# Patient Record
Sex: Female | Born: 1950
Health system: Southern US, Community
[De-identification: ages and names within clinical notes are randomized; demographics above are authoritative.]

## PROBLEM LIST (undated history)

## (undated) DIAGNOSIS — E785 Hyperlipidemia, unspecified: Secondary | ICD-10-CM

## (undated) DIAGNOSIS — T8859XA Other complications of anesthesia, initial encounter: Secondary | ICD-10-CM

## (undated) DIAGNOSIS — I10 Essential (primary) hypertension: Secondary | ICD-10-CM

## (undated) DIAGNOSIS — R112 Nausea with vomiting, unspecified: Secondary | ICD-10-CM

## (undated) DIAGNOSIS — M1990 Unspecified osteoarthritis, unspecified site: Secondary | ICD-10-CM

## (undated) DIAGNOSIS — M722 Plantar fascial fibromatosis: Secondary | ICD-10-CM

## (undated) DIAGNOSIS — Z9889 Other specified postprocedural states: Secondary | ICD-10-CM

## (undated) DIAGNOSIS — E039 Hypothyroidism, unspecified: Secondary | ICD-10-CM

## (undated) DIAGNOSIS — T4145XA Adverse effect of unspecified anesthetic, initial encounter: Secondary | ICD-10-CM

## (undated) DIAGNOSIS — S82009A Unspecified fracture of unspecified patella, initial encounter for closed fracture: Secondary | ICD-10-CM

## (undated) DIAGNOSIS — Z86010 Personal history of colonic polyps: Principal | ICD-10-CM

## (undated) DIAGNOSIS — R7302 Impaired glucose tolerance (oral): Secondary | ICD-10-CM

## (undated) DIAGNOSIS — N189 Chronic kidney disease, unspecified: Secondary | ICD-10-CM

## (undated) DIAGNOSIS — R011 Cardiac murmur, unspecified: Secondary | ICD-10-CM

## (undated) HISTORY — DX: Essential (primary) hypertension: I10

## (undated) HISTORY — DX: Impaired glucose tolerance (oral): R73.02

## (undated) HISTORY — DX: Chronic kidney disease, unspecified: N18.9

## (undated) HISTORY — PX: CATARACT EXTRACTION: SUR2

## (undated) HISTORY — DX: Cardiac murmur, unspecified: R01.1

## (undated) HISTORY — DX: Personal history of colonic polyps: Z86.010

## (undated) HISTORY — DX: Unspecified fracture of unspecified patella, initial encounter for closed fracture: S82.009A

## (undated) HISTORY — DX: Unspecified osteoarthritis, unspecified site: M19.90

## (undated) HISTORY — DX: Hyperlipidemia, unspecified: E78.5

---

## 1979-10-23 HISTORY — PX: APPENDECTOMY: SHX54

## 1981-10-22 HISTORY — PX: VAGINAL HYSTERECTOMY: SHX2639

## 1983-10-23 HISTORY — PX: COLON RESECTION: SHX5231

## 1993-10-22 HISTORY — PX: THYROIDECTOMY: SHX17

## 2001-10-22 HISTORY — PX: FOOT SURGERY: SHX648

## 2004-06-01 ENCOUNTER — Other Ambulatory Visit: Admission: RE | Admit: 2004-06-01 | Discharge: 2004-06-01 | Payer: Self-pay | Admitting: Family Medicine

## 2004-09-04 ENCOUNTER — Other Ambulatory Visit: Admission: RE | Admit: 2004-09-04 | Discharge: 2004-09-04 | Payer: Self-pay | Admitting: Family Medicine

## 2005-04-03 ENCOUNTER — Other Ambulatory Visit: Admission: RE | Admit: 2005-04-03 | Discharge: 2005-04-03 | Payer: Self-pay | Admitting: Family Medicine

## 2005-06-12 ENCOUNTER — Other Ambulatory Visit: Admission: RE | Admit: 2005-06-12 | Discharge: 2005-06-12 | Payer: Self-pay | Admitting: Family Medicine

## 2005-10-22 HISTORY — PX: OTHER SURGICAL HISTORY: SHX169

## 2006-01-15 ENCOUNTER — Emergency Department (HOSPITAL_COMMUNITY): Admission: EM | Admit: 2006-01-15 | Discharge: 2006-01-15 | Payer: Self-pay | Admitting: Emergency Medicine

## 2006-01-17 ENCOUNTER — Ambulatory Visit (HOSPITAL_COMMUNITY): Admission: RE | Admit: 2006-01-17 | Discharge: 2006-01-18 | Payer: Self-pay | Admitting: Orthopedic Surgery

## 2006-04-08 ENCOUNTER — Other Ambulatory Visit: Admission: RE | Admit: 2006-04-08 | Discharge: 2006-04-08 | Payer: Self-pay | Admitting: Family Medicine

## 2006-05-08 ENCOUNTER — Ambulatory Visit (HOSPITAL_COMMUNITY): Admission: RE | Admit: 2006-05-08 | Discharge: 2006-05-08 | Payer: Self-pay | Admitting: Family Medicine

## 2007-05-08 ENCOUNTER — Ambulatory Visit (HOSPITAL_COMMUNITY): Admission: RE | Admit: 2007-05-08 | Discharge: 2007-05-08 | Payer: Self-pay | Admitting: Family Medicine

## 2007-05-19 ENCOUNTER — Ambulatory Visit (HOSPITAL_COMMUNITY): Admission: RE | Admit: 2007-05-19 | Discharge: 2007-05-19 | Payer: Self-pay | Admitting: Family Medicine

## 2011-03-09 NOTE — Op Note (Signed)
NAMELUCYLE, ALUMBAUGH               ACCOUNT NO.:  [id number]   MEDICAL RECORD NO.:  [id number]          PATIENT TYPE:  OIB   LOCATION:  3010                         FACILITY:  [facility]   PHYSICIAN:  Doralee Albino. Carola Frost, M.D. DATE OF BIRTH:  Aug 03, 1951   DATE OF PROCEDURE:  01/17/2006  DATE OF DISCHARGE:  01/18/2006                                 OPERATIVE REPORT   PREOPERATIVE DIAGNOSIS:  Left patella fracture.   POSTOPERATIVE DIAGNOSIS:  Left patella fracture.   PROCEDURE:  Open reduction and internal fixation, left patella.   SURGEON:  Doralee Albino. Carola Frost, M.D.   ASSISTANT:  None.   ANESTHESIA:  General.   COMPLICATIONS:  None.   TOURNIQUET:  None.   ESTIMATED BLOOD LOSS:  150 mL, mostly hematoma.   DISPOSITION:  PACU.   CONDITION:  Stable.   BRIEF SUMMARY OF INDICATION FOR PROCEDURE:  Ms. Sargent is a where 60-year-  old female who had a work-related fall resulting in left knee pain and  inability to extend the knee.  Further workup demonstrated a patella  fracture.  After discussion of the risk and benefits of surgery including  the possibility of decreased range of motion of the knee, arthritis,  nonunion, malunion, nerve injury, vessel injury, need for further surgery,  DVT and PE and other complications, the patient wished proceed.   DESCRIPTION OF PROCEDURE:  Ms. Labarbera was administered a test dose of Ancef  given her allergies to PENICILLIN, which she tolerated quite well, and then  was able to receive a full dose.  She was taken to the operating room, where  general anesthesia was induced.  Her left lower extremity was prepped and  draped in the usual sterile fashion.  A standard midline incision was then  made of about 5 cm in length.  Dissection was carried down to the peritenon,  which was split longitudinally revealing the fracture site, which was full  of hematoma.  The hematoma was removed with a curette, which allowed [place] in  the intra-articular hematoma, which  was evacuated with suction.  A copious  irrigation and curettes were used to remove the adherent hematoma from the  fracture edges.  The fracture was then reduced and held provisionally with a  clamp.  Two 2-0 K-wires were inserted retrograde and then #7 cardiothoracic  wire used to create a figure-of-eight tension band construct.  The two limbs  were tightened sequentially and symmetrically in order to achieve  compression across the fracture site.  The tenaculum clamp was then released  and the knee taken through range of motion up to 45 degrees and noted to be  quite stable with no motion at the fracture site.  The retinaculum was then  oversewn with #1 figure-of-eight Vicryl sutures and then a 0 for the  peritenon layer and then the subcu with 2-0 and the skin with staples.  A  sterile gently compressive dressing was applied and a knee immobilizer.  The  patient was taken to the PACU in stable condition.   PROGNOSIS:  Ms. Guadalupe should do well following repair of this fracture  given the stable nature of the construct.  She should be allowed to  weightbear as tolerated in a knee immobilizer at two.  After her wound  appears to be stable, we will allow her to begin range of motion from 0-40  degrees with progressive range of motion by 30 degrees every two weeks  thereafter until achieving full motion with active extension beginning at  six weeks.  She will be on Lovenox for DVT prophylaxis while in the hospital  and discharged on a baby aspirin.Doralee Albino. Carola Frost, M.D.  Electronically Signed     [facility]/MEDQ  D:  01/17/2006  T:  01/19/2006  Job:  [id number]

## 2013-07-10 ENCOUNTER — Other Ambulatory Visit (HOSPITAL_COMMUNITY): Payer: Self-pay | Admitting: Family Medicine

## 2013-07-10 DIAGNOSIS — Z1231 Encounter for screening mammogram for malignant neoplasm of breast: Secondary | ICD-10-CM

## 2013-07-24 ENCOUNTER — Encounter (INDEPENDENT_AMBULATORY_CARE_PROVIDER_SITE_OTHER): Payer: Self-pay | Admitting: Ophthalmology

## 2013-07-24 DIAGNOSIS — H2510 Age-related nuclear cataract, unspecified eye: Secondary | ICD-10-CM

## 2013-07-24 DIAGNOSIS — H43819 Vitreous degeneration, unspecified eye: Secondary | ICD-10-CM

## 2013-07-24 DIAGNOSIS — I10 Essential (primary) hypertension: Secondary | ICD-10-CM

## 2013-07-24 DIAGNOSIS — H35039 Hypertensive retinopathy, unspecified eye: Secondary | ICD-10-CM

## 2013-07-27 ENCOUNTER — Ambulatory Visit (HOSPITAL_COMMUNITY)
Admission: RE | Admit: 2013-07-27 | Discharge: 2013-07-27 | Disposition: A | Discharge status: Home / Self Care | Payer: Self-pay | Source: Ambulatory Visit | Attending: Family Medicine | Admitting: Family Medicine

## 2013-07-27 DIAGNOSIS — Z1231 Encounter for screening mammogram for malignant neoplasm of breast: Secondary | ICD-10-CM

## 2014-07-20 ENCOUNTER — Other Ambulatory Visit (HOSPITAL_COMMUNITY): Payer: Self-pay | Admitting: Family Medicine

## 2014-07-20 DIAGNOSIS — Z1231 Encounter for screening mammogram for malignant neoplasm of breast: Secondary | ICD-10-CM

## 2014-08-09 ENCOUNTER — Ambulatory Visit (HOSPITAL_COMMUNITY)
Admission: RE | Admit: 2014-08-09 | Discharge: 2014-08-09 | Disposition: A | Discharge status: Home / Self Care | Payer: Self-pay | Source: Ambulatory Visit | Attending: Family Medicine | Admitting: Family Medicine

## 2014-08-09 DIAGNOSIS — Z1231 Encounter for screening mammogram for malignant neoplasm of breast: Secondary | ICD-10-CM

## 2015-05-31 DIAGNOSIS — N183 Chronic kidney disease, stage 3 (moderate): Secondary | ICD-10-CM | POA: Diagnosis not present

## 2015-05-31 DIAGNOSIS — Z23 Encounter for immunization: Secondary | ICD-10-CM | POA: Diagnosis not present

## 2015-05-31 DIAGNOSIS — E782 Mixed hyperlipidemia: Secondary | ICD-10-CM | POA: Diagnosis not present

## 2015-05-31 DIAGNOSIS — E890 Postprocedural hypothyroidism: Secondary | ICD-10-CM | POA: Diagnosis not present

## 2015-05-31 DIAGNOSIS — I10 Essential (primary) hypertension: Secondary | ICD-10-CM | POA: Diagnosis not present

## 2015-05-31 DIAGNOSIS — Z1272 Encounter for screening for malignant neoplasm of vagina: Secondary | ICD-10-CM | POA: Diagnosis not present

## 2015-05-31 DIAGNOSIS — Z0000 Encounter for general adult medical examination without abnormal findings: Secondary | ICD-10-CM | POA: Diagnosis not present

## 2015-05-31 DIAGNOSIS — Z1389 Encounter for screening for other disorder: Secondary | ICD-10-CM | POA: Diagnosis not present

## 2015-05-31 DIAGNOSIS — R7309 Other abnormal glucose: Secondary | ICD-10-CM | POA: Diagnosis not present

## 2015-05-31 DIAGNOSIS — Z9181 History of falling: Secondary | ICD-10-CM | POA: Diagnosis not present

## 2015-06-01 ENCOUNTER — Encounter: Payer: Self-pay | Admitting: Internal Medicine

## 2015-06-29 DIAGNOSIS — H2513 Age-related nuclear cataract, bilateral: Secondary | ICD-10-CM | POA: Diagnosis not present

## 2015-06-29 DIAGNOSIS — H40033 Anatomical narrow angle, bilateral: Secondary | ICD-10-CM | POA: Diagnosis not present

## 2015-07-05 DIAGNOSIS — H43393 Other vitreous opacities, bilateral: Secondary | ICD-10-CM | POA: Diagnosis not present

## 2015-07-20 ENCOUNTER — Ambulatory Visit (AMBULATORY_SURGERY_CENTER): Payer: Self-pay | Admitting: *Deleted

## 2015-07-20 VITALS — Ht 64.5 in | Wt 150.0 lb

## 2015-07-20 DIAGNOSIS — Z1211 Encounter for screening for malignant neoplasm of colon: Secondary | ICD-10-CM

## 2015-07-20 NOTE — Progress Notes (Signed)
Denies allergies to eggs or soy products. Denies complications with sedation or anesthesia. Denies O2 use. Denies use of diet or weight loss medications.  Emmi instructions not given for colonoscopy. Patient does not have access to a computer

## 2015-08-03 ENCOUNTER — Encounter: Payer: Self-pay | Admitting: Internal Medicine

## 2015-08-03 ENCOUNTER — Ambulatory Visit (AMBULATORY_SURGERY_CENTER): Payer: Commercial Managed Care - HMO | Admitting: Internal Medicine

## 2015-08-03 VITALS — BP 157/77 | HR 46 | Temp 97.3°F | Resp 17 | Ht 64.5 in | Wt 150.0 lb

## 2015-08-03 DIAGNOSIS — I10 Essential (primary) hypertension: Secondary | ICD-10-CM | POA: Diagnosis not present

## 2015-08-03 DIAGNOSIS — D120 Benign neoplasm of cecum: Secondary | ICD-10-CM

## 2015-08-03 DIAGNOSIS — Z800 Family history of malignant neoplasm of digestive organs: Secondary | ICD-10-CM

## 2015-08-03 DIAGNOSIS — Z1211 Encounter for screening for malignant neoplasm of colon: Secondary | ICD-10-CM

## 2015-08-03 MED ORDER — SODIUM CHLORIDE 0.9 % IV SOLN: 500.0000 mL | INTRAVENOUS | Status: DC

## 2015-08-03 NOTE — Progress Notes (Signed)
Called to room to assist during endoscopic procedure.  Patient ID and intended procedure confirmed with present staff. Received instructions for my participation in the procedure from the performing physician.  

## 2015-08-03 NOTE — Op Note (Signed)
[facility]  [address] [place], [address]   COLONOSCOPY PROCEDURE REPORT  PATIENT: Debra Barry, Debra Barry  MR#: [id number] BIRTHDATE: 1951/10/03 , 64  yrs. old GENDER: female ENDOSCOPIST: Gatha Mayer, MD, [facility] PROCEDURE DATE:  08/03/2015 PROCEDURE:   Colonoscopy, screening and Colonoscopy with snare polypectomy First Screening Colonoscopy - Avg.  risk and is 50 yrs.  old or older Yes.  Prior Negative Screening - Now for repeat screening. N/A  History of Adenoma - Now for follow-up colonoscopy & has been > or = to 3 yrs.  N/A  Polyps removed today? Yes ASA CLASS:   Class II INDICATIONS:Screening for colonic neoplasia and FH Colon or Rectal Adenocarcinoma. MEDICATIONS: Propofol 180 mg IV and Monitored anesthesia care  DESCRIPTION OF PROCEDURE:   After the risks benefits and alternatives of the procedure were thoroughly explained, informed consent was obtained.  The digital rectal exam revealed no abnormalities of the rectum.   The LB [id number]  endoscope was introduced through the anus and advanced to the cecum, which was identified by both the appendix and ileocecal valve. No adverse events experienced.   The quality of the prep was excellent. (MiraLax was used)  The instrument was then slowly withdrawn as the colon was fully examined. Estimated blood loss is zero unless otherwise noted in this procedure report.   COLON FINDINGS: A polypoid shaped sessile polyp measuring 4 mm in size was found at the cecum.  A polypectomy was performed with a cold snare.   There was diverticulosis noted in the sigmoid colon. The examination was otherwise normal.   Right colon retroflexion included.  Retroflexed views revealed no abnormalities. The time to cecum = 3.6 Withdrawal time = 8.3   The scope was withdrawn and the procedure completed. COMPLICATIONS: There were no immediate complications.  ENDOSCOPIC IMPRESSION: 1.   Sessile polyp was found at the cecum;  polypectomy was performed with a cold snare 2.   Diverticulosis was noted in the sigmoid colon 3.   The examination was otherwise normal - excellent prep  RECOMMENDATIONS: Timing of repeat colonoscopy will be determined by pathology findings.  She has FHx rectal cancer father age 38 and colon cancer sister age 4  eSigned:  Gatha Mayer, MD, [facility] 08/03/2015 12:06 PM   cc: Dr. Daiva Eves and The Patient

## 2015-08-03 NOTE — Progress Notes (Signed)
A/ox3 pleased with MAC, report to RN 

## 2015-08-03 NOTE — Patient Instructions (Addendum)
I found and removed 1 small polyp. You also have a condition called diverticulosis - common and not usually a problem. Please read the handout provided.  I will let you know pathology results and when to have another routine colonoscopy by mail. Likely in 5 years 2021  I appreciate the opportunity to care for you. Gatha Mayer, MD, FACG  YOU HAD AN ENDOSCOPIC PROCEDURE TODAY AT [facility]:   Refer to the procedure report that was given to you for any specific questions about what was found during the examination.  If the procedure report does not answer your questions, please call your gastroenterologist to clarify.  If you requested that your care partner not be given the details of your procedure findings, then the procedure report has been included in a sealed envelope for you to review at your convenience later.  YOU SHOULD EXPECT: Some feelings of bloating in the abdomen. Passage of more gas than usual.  Walking can help get rid of the air that was put into your GI tract during the procedure and reduce the bloating. If you had a lower endoscopy (such as a colonoscopy or flexible sigmoidoscopy) you may notice spotting of blood in your stool or on the toilet paper. If you underwent a bowel prep for your procedure, you may not have a normal bowel movement for a few days.  Please Note:  You might notice some irritation and congestion in your nose or some drainage.  This is from the oxygen used during your procedure.  There is no need for concern and it should clear up in a day or so.  SYMPTOMS TO REPORT IMMEDIATELY:   Following lower endoscopy (colonoscopy or flexible sigmoidoscopy):  Excessive amounts of blood in the stool  Significant tenderness or worsening of abdominal pains  Swelling of the abdomen that is new, acute  Fever of 100F or higher  For urgent or emergent issues, a gastroenterologist can be reached at any hour by calling [phone]  [phone].   DIET: Your first meal following the procedure should be a small meal and then it is ok to progress to your normal diet. Heavy or fried foods are harder to digest and may make you feel nauseous or bloated.  Likewise, meals heavy in dairy and vegetables can increase bloating.  Drink plenty of fluids but you should avoid alcoholic beverages for 24 hours.  ACTIVITY:  You should plan to take it easy for the rest of today and you should NOT DRIVE or use heavy machinery until tomorrow (because of the sedation medicines used during the test).    FOLLOW UP: Our staff will call the number listed on your records the next business day following your procedure to check on you and address any questions or concerns that you may have regarding the information given to you following your procedure. If we do not reach you, we will leave a message.  However, if you are feeling well and you are not experiencing any problems, there is no need to return our call.  We will assume that you have returned to your regular daily activities without incident.  If any biopsies were taken you will be contacted by phone or by letter within the next 1-3 weeks.  Please call us at [phone] if you have not heard about the biopsies in 3 weeks.    SIGNATURES/CONFIDENTIALITY: You and/or your care partner have signed paperwork which will be entered into your electronic medical record.  These signatures attest to the fact that that the information above on your After Visit Summary has been reviewed and is understood.  Full responsibility of the confidentiality of this discharge information lies with you and/or your care-partner.  Polyps, diverticulosis-handouts given  Repeat colonoscopy will be determined by pathology.

## 2015-08-04 ENCOUNTER — Telehealth: Payer: Self-pay | Admitting: *Deleted

## 2015-08-04 NOTE — Telephone Encounter (Signed)
  Follow up Call-  Call back number 08/03/2015  Post procedure Call Back phone  # [phone]  Permission to leave phone message Yes     Patient questions:  Do you have a fever, pain , or abdominal swelling? No. Pain Score  0 *  Have you tolerated food without any problems? Yes.    Have you been able to return to your normal activities? Yes.    Do you have any questions about your discharge instructions: Diet   No. Medications  No. Follow up visit  No.  Do you have questions or concerns about your Care? No.  Actions: * If pain score is 4 or above: No action needed, pain <4.

## 2015-08-11 ENCOUNTER — Encounter: Payer: Self-pay | Admitting: Internal Medicine

## 2015-08-11 DIAGNOSIS — Z860101 Personal history of adenomatous and serrated colon polyps: Secondary | ICD-10-CM

## 2015-08-11 DIAGNOSIS — Z86010 Personal history of colonic polyps: Secondary | ICD-10-CM

## 2015-08-11 DIAGNOSIS — Z800 Family history of malignant neoplasm of digestive organs: Secondary | ICD-10-CM | POA: Insufficient documentation

## 2015-08-11 HISTORY — DX: Personal history of colonic polyps: Z86.010

## 2015-08-11 HISTORY — DX: Personal history of adenomatous and serrated colon polyps: Z86.0101

## 2015-08-11 NOTE — Progress Notes (Signed)
Quick Note:  Diminutive adenoma + FHx CRCA father and sister Repeat colonoscopy 2021 ______

## 2015-09-05 DIAGNOSIS — Z1231 Encounter for screening mammogram for malignant neoplasm of breast: Secondary | ICD-10-CM | POA: Diagnosis not present

## 2015-09-05 DIAGNOSIS — Z6825 Body mass index (BMI) 25.0-25.9, adult: Secondary | ICD-10-CM | POA: Diagnosis not present

## 2015-09-05 DIAGNOSIS — E2839 Other primary ovarian failure: Secondary | ICD-10-CM | POA: Diagnosis not present

## 2015-09-05 DIAGNOSIS — M722 Plantar fascial fibromatosis: Secondary | ICD-10-CM | POA: Diagnosis not present

## 2015-09-27 DIAGNOSIS — Z6841 Body Mass Index (BMI) 40.0 and over, adult: Secondary | ICD-10-CM | POA: Diagnosis not present

## 2015-09-27 DIAGNOSIS — W101XXA Fall (on)(from) sidewalk curb, initial encounter: Secondary | ICD-10-CM | POA: Diagnosis not present

## 2015-09-27 DIAGNOSIS — M25562 Pain in left knee: Secondary | ICD-10-CM | POA: Diagnosis not present

## 2015-09-28 DIAGNOSIS — M722 Plantar fascial fibromatosis: Secondary | ICD-10-CM | POA: Diagnosis not present

## 2015-09-28 DIAGNOSIS — S82002A Unspecified fracture of left patella, initial encounter for closed fracture: Secondary | ICD-10-CM | POA: Diagnosis not present

## 2015-10-04 DIAGNOSIS — Z1231 Encounter for screening mammogram for malignant neoplasm of breast: Secondary | ICD-10-CM | POA: Diagnosis not present

## 2015-10-04 DIAGNOSIS — M810 Age-related osteoporosis without current pathological fracture: Secondary | ICD-10-CM | POA: Diagnosis not present

## 2015-10-06 DIAGNOSIS — R922 Inconclusive mammogram: Secondary | ICD-10-CM | POA: Diagnosis not present

## 2015-10-06 DIAGNOSIS — Z1231 Encounter for screening mammogram for malignant neoplasm of breast: Secondary | ICD-10-CM | POA: Diagnosis not present

## 2015-10-06 DIAGNOSIS — M810 Age-related osteoporosis without current pathological fracture: Secondary | ICD-10-CM | POA: Diagnosis not present

## 2015-10-06 DIAGNOSIS — R928 Other abnormal and inconclusive findings on diagnostic imaging of breast: Secondary | ICD-10-CM | POA: Diagnosis not present

## 2015-10-12 DIAGNOSIS — M722 Plantar fascial fibromatosis: Secondary | ICD-10-CM | POA: Diagnosis not present

## 2015-10-18 DIAGNOSIS — M25572 Pain in left ankle and joints of left foot: Secondary | ICD-10-CM | POA: Diagnosis not present

## 2015-11-02 DIAGNOSIS — M25572 Pain in left ankle and joints of left foot: Secondary | ICD-10-CM | POA: Diagnosis not present

## 2015-11-02 DIAGNOSIS — M722 Plantar fascial fibromatosis: Secondary | ICD-10-CM | POA: Diagnosis not present

## 2015-11-10 DIAGNOSIS — M722 Plantar fascial fibromatosis: Secondary | ICD-10-CM | POA: Diagnosis not present

## 2015-11-23 DIAGNOSIS — M722 Plantar fascial fibromatosis: Secondary | ICD-10-CM | POA: Diagnosis not present

## 2015-11-30 ENCOUNTER — Ambulatory Visit: Payer: Commercial Managed Care - HMO | Attending: Sports Medicine

## 2015-11-30 DIAGNOSIS — M722 Plantar fascial fibromatosis: Secondary | ICD-10-CM

## 2015-11-30 DIAGNOSIS — R531 Weakness: Secondary | ICD-10-CM

## 2015-11-30 DIAGNOSIS — R262 Difficulty in walking, not elsewhere classified: Secondary | ICD-10-CM

## 2015-11-30 NOTE — Therapy (Addendum)
[place], [place], [address] Phone: [phone]   Fax:  [phone]  Physical Therapy Evaluation  Patient Details  Name: Debra Barry MRN: [id number] Date of Birth: Apr 24, 1951 Referring Provider: Wandra Feinstein  Encounter Date: 11/30/2015      PT End of Session - 11/30/15 0942    Visit Number 1   Number of Visits 8   Date for PT Re-Evaluation 01/24/16   PT Start Time 0845   PT Stop Time 0930   PT Time Calculation (min) 45 min   Activity Tolerance Patient tolerated treatment well   Behavior During Therapy [facility] for tasks assessed/performed      Past Medical History  Diagnosis Date  . Hypertension   . Arthritis   . Heart murmur   . Patella fracture right     2014  . Hyperlipidemia   . Hx of adenomatous polyp of colon 08/11/2015    Past Surgical History  Procedure Laterality Date  . Appendectomy  1981  . Vaginal hysterectomy  1983  . Colon resection  1985    benign tumor  . Thyroidectomy  1995  . Foot surgery Left 2003  . Patella fracture repair Left 2007    There were no vitals filed for this visit.  Visit Diagnosis:  Plantar fasciitis of left foot - Plan: PT PLAN OF CARE CERT/RE-CERT  Weakness - Plan: PT PLAN OF CARE CERT/RE-CERT  Difficulty in walking - Plan: PT PLAN OF CARE CERT/RE-CERT      Subjective Assessment - 11/30/15 0854    Subjective L foot / heel pain began Nov 11th, 2016.  Pt received steroid injection without relief. Pt was prescribed walking shoe without relief, so they casted foot, but pt had an allergic reaction and cast was removed and pt was put back in shoe.  Pt is wearing walking shoe in weightbearing activities. At night, she has spint that she wears for sleeping.  Pt rpeorts pain worsens with prolonged activity, and is pain-free sitting. Pt fractured L knee Dec 6th, 2016 when she fell on ice, but L knee symptoms have resolved completely and does not have  difficulty with it.     Pertinent History HTN, heart murmur, arthritis, fracuted patella 2006 and again on Dec 5th. "Nodules" removed from L foot 2003.    How long can you sit comfortably? NA   How long can you stand comfortably? 15 mins    How long can you walk comfortably? 30 mins    Currently in Pain? Yes   Pain Score 0-No pain  3/10 with movement, at end of day 10/10.    Pain Location Heel   Pain Orientation Left   Pain Descriptors / Indicators Sharp;Stabbing;Throbbing   Pain Type Acute pain   Pain Onset More than a month ago   Pain Frequency Intermittent   Aggravating Factors  prolonged activity    Pain Relieving Factors elevate, ice     Effect of Pain on Daily Activities difficulty caring for her sister.             [facility] PT Assessment - 11/30/15 0001    Assessment   Medical Diagnosis L heel pain    Referring Provider Wandra Feinstein   Onset Date/Surgical Date 09/02/15   Hand Dominance Right   Next MD Visit 12/21/15   Prior Therapy steroid injection    Precautions   Precautions None   Restrictions   Weight Bearing Restrictions No   Balance Screen  Has the patient fallen in the past 6 months Yes   How many times? 1   Has the patient had a decrease in activity level because of a fear of falling?  No   Is the patient reluctant to leave their home because of a fear of falling?  No   Home Ecologist residence   Prior Function   Level of Independence Independent   Observation/Other Assessments   Focus on Therapeutic Outcomes (FOTO)  Intake: 38%, Predicted: 35%   ROM / Strength   AROM / PROM / Strength AROM;PROM;Strength   AROM   Right/Left Ankle Right;Left   Right Ankle Dorsiflexion 5   Right Ankle Plantar Flexion 60   Right Ankle Inversion 50   Right Ankle Eversion 12   Left Ankle Dorsiflexion 5   Left Ankle Plantar Flexion 52   Left Ankle Inversion 44   Left Ankle Eversion 10   Strength   Strength Assessment Site Ankle    Right/Left Ankle Right;Left   Right Ankle Dorsiflexion 4-/5   Right Ankle Plantar Flexion 3/5   Right Ankle Inversion 4-/5   Right Ankle Eversion 4-/5   Left Ankle Dorsiflexion 3+/5   Left Ankle Plantar Flexion 2-/5   Left Ankle Inversion 3+/5   Left Ankle Eversion 3+/5   Palpation   Palpation comment TP at L heel plantar surface, plantar fascia insertion , calcaneal tuberosity and fat pad.                             PT Education - 11/30/15 0941    Education provided Yes   Education Details PT POC for 3 times a week, but pt only able to come 1 x due to high co-pay. HEP: Ankle AROM.   Person(s) Educated Patient   Methods Explanation;Demonstration;Verbal cues;Handout   Comprehension Verbalized understanding;Returned demonstration          PT Short Term Goals - 11/30/15 1310    PT SHORT TERM GOAL #1   Title Pt will be I with initial HEP for continued stretching and strengthening and mobility.     Time 4   Period Weeks   Status New   PT SHORT TERM GOAL #2   Title L DF AROM will improve to 10 degrees pain-free for improved standing tolerance.    Time 4   Period Weeks   Status New           PT Long Term Goals - 11/30/15 1312    PT LONG TERM GOAL #1   Title FOTO score will improve from 38%  limited to 35% limited  to demo improved function and mobility by 01/24/16.   Time 8   Period Weeks   Status New   PT LONG TERM GOAL #2   Title Pt will tolerate standing and walking for 1 hour without pain by 01/24/16.    Time 8   Period Weeks   Status New   PT LONG TERM GOAL #3   Title L ankle PF strength will improve to 4/5 pain free in order to ascend and descend stairs, pain free by 01/24/16.    Time 8   Period Weeks   Status New               Plan - 11/30/15 1305    Clinical Impression Statement Pt presents for low complexity evaluation for L foot pain. Signs and symptoms are compatible with L  plantar fasciitis. Pt presents with impairments including  pain, impaired mobility/ROM, and impaired strength, which limit pt's functional abilities with walking, standing, stairs.  Pt will benefit from oupt PT for 3 times a week for 8 weeks in order to address these impairments and functional limitations and return pt to pain-free PLOF, but pt is only able to come 1 x a week due to co-pay.     Pt will benefit from skilled therapeutic intervention in order to improve on the following deficits Abnormal gait;Decreased activity tolerance;Decreased range of motion;Decreased strength;Difficulty walking;Impaired flexibility;Improper body mechanics;Impaired UE functional use;Pain   Rehab Potential Good   Clinical Impairments Affecting Rehab Potential only able to come 1 x a week.    PT Frequency 1x / week   PT Duration 8 weeks   PT Treatment/Interventions ADLs/Self Care Home Management;Ultrasound;Cryotherapy;Electrical Stimulation;Iontophoresis 4mg /ml Dexamethasone;Moist Heat;Therapeutic exercise;Therapeutic activities;Functional mobility training;Stair training;Gait training;Neuromuscular re-education;Patient/family education;Manual techniques;Taping;Passive range of motion;Dry needling   PT Next Visit Plan Est. stretches for plantarfascitis and prescribe in HEP. Manual therapy.  Education.  Pt only coming 1x a week so needs thourough HEP and education.    Consulted and Agree with Plan of Care Patient         01/04/2016 1307  PT G-Codes  Functional Assessment Tool Used    Functional Limitation  Mobility: Walking and moving around Mobility: Walking and moving around at 1307 on 04-Jan-2016 by Dollene Cleveland, PT  Mobility: Walking and Moving Around Current Status [phone])  At least 40 percent but less than 60 percent impaired, limited or restricted CK at 1307 on 01-04-16 by Dollene Cleveland, PT  Mobility: Walking and Moving Around Goal Status [phone])  At least 20 percent but less than 40 percent impaired, limited or restricted CJ at 1307 on 04-Jan-2016 by Dollene Cleveland, PT  Mobility: Walking and Moving Around Discharge Status [phone])          Problem List Patient Active Problem List   Diagnosis Date Noted  . Hx of adenomatous polyp of colon 08/11/2015  . Family history of colon cancer - father + sister both in 62's 08/11/2015    Dollene Cleveland, PT 11/30/2015, 1:24 PM  [facility] [address] [place], [place], [address] Phone: [phone]   Fax:  [phone]  Name: CYNDI DEWOLF MRN: [id number] Date of Birth: Jul 15, 1951

## 2015-11-30 NOTE — Patient Instructions (Signed)
10 x each. 3 times a day   ROM: Inversion / Eversion   With left leg relaxed, gently turn ankle and foot in and out. Move through full range of motion. Avoid pain. Repeat ____ times per set. Do ____ sets per session. Do ____ sessions per day.  http://orth.exer.us/36   Copyright  VHI. All rights reserved.  ROM: Plantar / Dorsiflexion   With left leg relaxed, gently flex and extend ankle. Move through full range of motion. Avoid pain. Repeat ____ times per set. Do ____ sets per session. Do ____ sessions per day.  http://orth.exer.us/34   Copyright  VHI. All rights reserved.  Ankle Alphabet   Using left ankle and foot only, trace the letters of the alphabet. Perform A to Z. Repeat ____ times per set. Do ____ sets per session. Do ____ sessions per day.  http://orth.exer.us/16   Copyright  VHI. All rights reserved.  Ankle Circles   Slowly rotate right foot and ankle clockwise then counterclockwise. Gradually increase range of motion. Avoid pain. Circle ____ times each direction per set. Do ____ sets per session. Do ____ sessions per day.  http://orth.exer.us/30   Copyright  VHI. All rights reserved.

## 2015-12-06 ENCOUNTER — Ambulatory Visit: Payer: Commercial Managed Care - HMO | Admitting: Physical Therapy

## 2015-12-06 DIAGNOSIS — M722 Plantar fascial fibromatosis: Secondary | ICD-10-CM | POA: Diagnosis not present

## 2015-12-06 DIAGNOSIS — R531 Weakness: Secondary | ICD-10-CM

## 2015-12-06 DIAGNOSIS — R262 Difficulty in walking, not elsewhere classified: Secondary | ICD-10-CM | POA: Diagnosis not present

## 2015-12-06 NOTE — Patient Instructions (Signed)
PLANTARFLEXION STRENGTHENING:   Balancing ActANKLE: Plantarflexion, Bilateral - Standing   Stand with upright posture. Raise heels up as high as possible. __10_ reps per set, __2_ sets per day, __7_ days per week Hold onto a support.     DORSIFLEXION STRENGTHENING:  Toe Raise (Standing)   Rock back on heels. Repeat __10-20__ times per set. Do __1-2__ sets per session. Do __2__ sessions per day.   SINGLE LIMB STANCE   Stance: single leg on floor. Raise leg. Hold __60 seconds. Repeat with other leg. _1__ reps per set, _2__ sets per day, __7_ days per week  Gastroc / Heel Cord Stretch - Seated With Towel   Sit on floor, towel around ball of foot. Gently pull foot in toward body, stretching heel cord and calf. Hold for ___ seconds. Repeat on involved leg. Repeat ___ times. Do ___ times per day.  Copyright  VHI. All rights reserved.  Achilles / Gastroc, Standing    Stand, left foot behind, heel on floor and turned slightly out, leg straight, forward leg bent. Move hips forward. Hold _30__ seconds. Repeat 3___ times per session. Do _2__ sessions per day.   Soleus Stretch    Stand with left foot back, both knees bent. Keeping heel on floor, turned slightly out, lean into wall until stretch is felt in lower calf. Hold _30___ seconds. Repeat __3__ times per set. Do _2___ sets per session. Do ____ sessions per day.  http://orth.exer.us/25   Inversion: Resisted   Cross legs with right leg underneath, foot in tubing loop. Hold tubing around other foot to resist and turn foot in. Repeat __10-20__ times per set. Do ___2_ sets per session. Do __2__ sessions per day.  http://orth.exer.us/12   Copyright  VHI. All rights reserved.  Eversion: Resisted   With right foot in tubing loop, hold tubing around other foot to resist and turn foot out. Repeat _10-20___ times per set. Do __2__ sets per session. Do __2__ sessions per day.  http://orth.exer.us/14   Copyright   VHI. All rights reserved.  Plantar Flexion: Resisted   Anchor behind, tubing around left foot, press down. Repeat __10-20__ times per set. Do _2___ sets per session. Do ____ sessions per day.  http://orth.exer.us/10   Copyright  VHI. All rights reserved.  Dorsiflexion: Resisted   Facing anchor, tubing around left foot, pull toward face.  Repeat __10-20__ times per set. Do __2__ sets per session. Do __2__ sessions per day.

## 2015-12-06 NOTE — Therapy (Addendum)
[place], [place], [address] Phone: [phone]   Fax:  [phone]  Physical Therapy Treatment  and Discharge Summary  Patient Details  Name: Debra Barry MRN: [id number] Date of Birth: 08-02-1951 Referring Provider: Wandra Feinstein  Encounter Date: 12/06/2015      PT End of Session - 12/06/15 0935    Visit Number 2   Number of Visits 8   Date for PT Re-Evaluation 01/24/16   PT Start Time 0931   PT Stop Time 1012   PT Time Calculation (min) 41 min      Past Medical History  Diagnosis Date  . Hypertension   . Arthritis   . Heart murmur   . Patella fracture right     2014  . Hyperlipidemia   . Hx of adenomatous polyp of colon 08/11/2015    Past Surgical History  Procedure Laterality Date  . Appendectomy  1981  . Vaginal hysterectomy  1983  . Colon resection  1985    benign tumor  . Thyroidectomy  1995  . Foot surgery Left 2003  . Patella fracture repair Left 2007    There were no vitals filed for this visit.  Visit Diagnosis:  Plantar fasciitis of left foot  Weakness  Difficulty in walking      Subjective Assessment - 12/06/15 1006    Subjective The exercises have helped with my flexibility and a little with the pain. This will have to be ,y last visit. I do not have the $45 co-pay   Currently in Pain? Yes   Pain Score 3    Pain Location Heel   Pain Orientation Left   Aggravating Factors  prolonged activity more than 1 hour   Pain Relieving Factors elevate, ice            [facility] PT Assessment - 12/06/15 0940    AROM   Right Ankle Dorsiflexion 7   Left Ankle Dorsiflexion 7                     [facility] Adult PT Treatment/Exercise - 12/06/15 0001    Ankle Exercises: Stretches   Soleus Stretch 2 reps;30 seconds   Gastroc Stretch 2 reps;30 seconds   Gastroc Stretch Limitations seated with towel and standing runners stretch vs step stretch   Ankle  Exercises: Standing   SLS 10 sec best bilateral   Heel Raises 10 reps   Toe Raise 10 reps   Ankle Exercises: Supine   T-Band red band x 15 each                PT Education - 12/06/15 1003    Education provided Yes   Education Details 4 way ankle red band, gastroc/ soleus stretch, SLS, heel/toe raises   Person(s) Educated Patient   Methods Explanation;Handout   Comprehension Verbalized understanding          PT Short Term Goals - 12/06/15 1029    PT SHORT TERM GOAL #1   Title Pt will be I with initial HEP for continued stretching and strengthening and mobility.     Time 4   Period Weeks   Status Achieved   PT SHORT TERM GOAL #2   Title L DF AROM will improve to 10 degrees pain-free for improved standing tolerance.    Time 4   Period Weeks   Status On-going           PT Long Term Goals -  12/06/15 1029    PT LONG TERM GOAL #1   Title FOTO score will improve from 38%  limited to 35% limited  to demo improved function and mobility by 01/24/16.   Time 8   Period Weeks   Status Unable to assess   PT LONG TERM GOAL #2   Title Pt will tolerate standing and walking for 1 hour without pain by 01/24/16.    Time 8   Period Weeks   Status On-going   PT LONG TERM GOAL #3   Title L ankle PF strength will improve to 4/5 pain free in order to ascend and descend stairs, pain free by 01/24/16.    Time 8   Period Weeks   Status On-going               Plan - 12/06/15 1030    Clinical Impression Statement Pt would like to hold PT after today due to copay. She will perform HEP until MD appointment in 2 weeks. After follow up with MD she may schedule another appointment or call and discharge. DF improved from 5 degrees to 7 degrees. Pt independent with initial HEP. STG#1 met.    PT Next Visit Plan Hold PT due to co-pay. Pt to call after MD follow up in 2 weeks.         Problem List Patient Active Problem List   Diagnosis Date Noted  . Hx of adenomatous polyp of  colon 08/11/2015  . Family history of colon cancer - father + sister both in 40's 08/11/2015    Dorene Ar, PTA 12/06/2015, 10:34 AM   Addendum 12/27/2015 By Dollene Cleveland, PT, DPT 12/27/2015 8:02 AM Phone: [phone] Fax: [phone]   PHYSICAL THERAPY DISCHARGE SUMMARY  Visits from Start of Care: 2  Current functional level related to goals / functional outcomes: See above    Remaining deficits: Pain, ROM, strength, difficulty with walking, standing, ADLs, stairs.   Education / Equipment: Indep with intial HEP.   Plan: Patient agrees to discharge.  Patient goals were not met. Patient is being discharged due to not returning since the last visit.  ?????        Pt held PT after 2 visits due to high co-pay and planned to call and schedule further visits or request DC following MD visits. No return call received following pt's MD visit to schedule further appts. Therefore, pt is discharged due to no further visits made  Dollene Cleveland, PT, DPT 12/27/2015 8:04 AM Phone: [phone] Fax: [phone]  .  [place] [place], [place], [address] Phone: [phone]   Fax:  [phone]  Name: Debra Barry MRN: [id number] Date of Birth: Mar 11, 1951

## 2015-12-07 DIAGNOSIS — N183 Chronic kidney disease, stage 3 (moderate): Secondary | ICD-10-CM | POA: Diagnosis not present

## 2015-12-07 DIAGNOSIS — E782 Mixed hyperlipidemia: Secondary | ICD-10-CM | POA: Diagnosis not present

## 2015-12-07 DIAGNOSIS — I10 Essential (primary) hypertension: Secondary | ICD-10-CM | POA: Diagnosis not present

## 2015-12-07 DIAGNOSIS — E890 Postprocedural hypothyroidism: Secondary | ICD-10-CM | POA: Diagnosis not present

## 2015-12-07 DIAGNOSIS — Z1159 Encounter for screening for other viral diseases: Secondary | ICD-10-CM | POA: Diagnosis not present

## 2015-12-07 DIAGNOSIS — R7303 Prediabetes: Secondary | ICD-10-CM | POA: Diagnosis not present

## 2015-12-07 DIAGNOSIS — Z6826 Body mass index (BMI) 26.0-26.9, adult: Secondary | ICD-10-CM | POA: Diagnosis not present

## 2015-12-21 DIAGNOSIS — M722 Plantar fascial fibromatosis: Secondary | ICD-10-CM | POA: Diagnosis not present

## 2016-01-10 DIAGNOSIS — M722 Plantar fascial fibromatosis: Secondary | ICD-10-CM | POA: Diagnosis not present

## 2016-01-24 DIAGNOSIS — E890 Postprocedural hypothyroidism: Secondary | ICD-10-CM | POA: Diagnosis not present

## 2016-02-01 ENCOUNTER — Encounter (HOSPITAL_BASED_OUTPATIENT_CLINIC_OR_DEPARTMENT_OTHER): Payer: Self-pay | Admitting: *Deleted

## 2016-02-06 ENCOUNTER — Encounter (HOSPITAL_BASED_OUTPATIENT_CLINIC_OR_DEPARTMENT_OTHER)
Admission: RE | Admit: 2016-02-06 | Discharge: 2016-02-06 | Disposition: A | Discharge status: Home / Self Care | Payer: Commercial Managed Care - HMO | Source: Ambulatory Visit | Attending: Orthopedic Surgery | Admitting: Orthopedic Surgery

## 2016-02-06 ENCOUNTER — Other Ambulatory Visit: Payer: Self-pay

## 2016-02-06 ENCOUNTER — Other Ambulatory Visit: Payer: Self-pay | Admitting: Physician Assistant

## 2016-02-06 DIAGNOSIS — R001 Bradycardia, unspecified: Secondary | ICD-10-CM | POA: Diagnosis not present

## 2016-02-06 DIAGNOSIS — M722 Plantar fascial fibromatosis: Secondary | ICD-10-CM | POA: Diagnosis not present

## 2016-02-06 DIAGNOSIS — Z79899 Other long term (current) drug therapy: Secondary | ICD-10-CM | POA: Diagnosis not present

## 2016-02-06 DIAGNOSIS — I10 Essential (primary) hypertension: Secondary | ICD-10-CM | POA: Diagnosis not present

## 2016-02-06 DIAGNOSIS — Z87891 Personal history of nicotine dependence: Secondary | ICD-10-CM | POA: Diagnosis not present

## 2016-02-06 NOTE — H&P (Signed)
This is a pleasant 65 year-old female who presents to our clinic today with left heel pain.  This has been ongoing for the past five months.  She stands on her feet a lot and attributes her pain to this.  Worse at the end of the day.  All of her pain is located to her heel at the plantar fascia insertion.  No retrocalcaneal pain.  She describes occasional needle poking her in the heel.  Worse with increased activity, as well as walking.  She has tried exercises for her Achilles, as well as cold and heat, which seems to help a little.  She has also been wearing night splints.  She comes in today wanting to proceed with definitive treatment with a plantar fasciotomy. Past medical, social and family history reviewed in detail on the patient questionnaire and signed.  Review of systems: As detailed in HPI.  All others reviewed and are negative.   EXAMINATION: Well-developed, well-nourished female in no acute distress.  Alert and oriented x 3.  Lungs clear to auscultation bilaterally.  Heart sounds normal.  Examination of her left foot reveals marked tenderness to the plantar fascia insertion.  No retrocalcaneal tenderness.  She can dorsiflex to about 10 degrees.  She is neurovascularly intact distally.    X-RAYS: X-rays reveal a moderate plantar fascial spur.    IMPRESSION: Left foot plantar fasciitis.    PLAN: At this point Debra Barry has exhausted nearly all conservative treatment options and we feel it is appropriate to proceed with left heel endoscopic plantar fasciotomy.  Risks, benefits and possible complications of surgery have been reviewed.  Rehab and recovery time discussed. All questions answered.  We will see Debra Barry at the time of operative intervention.    Debra Barry, M.D.

## 2016-02-09 ENCOUNTER — Encounter (HOSPITAL_BASED_OUTPATIENT_CLINIC_OR_DEPARTMENT_OTHER): Payer: Self-pay | Admitting: *Deleted

## 2016-02-09 ENCOUNTER — Ambulatory Visit (HOSPITAL_BASED_OUTPATIENT_CLINIC_OR_DEPARTMENT_OTHER): Payer: Commercial Managed Care - HMO | Admitting: Anesthesiology

## 2016-02-09 ENCOUNTER — Encounter (HOSPITAL_BASED_OUTPATIENT_CLINIC_OR_DEPARTMENT_OTHER)
Admission: RE | Disposition: A | Discharge status: Home / Self Care | Payer: Self-pay | Source: Ambulatory Visit | Attending: Orthopedic Surgery

## 2016-02-09 ENCOUNTER — Ambulatory Visit (HOSPITAL_BASED_OUTPATIENT_CLINIC_OR_DEPARTMENT_OTHER)
Admission: RE | Admit: 2016-02-09 | Discharge: 2016-02-09 | Disposition: A | Discharge status: Home / Self Care | Payer: Commercial Managed Care - HMO | Source: Ambulatory Visit | Attending: Orthopedic Surgery | Admitting: Orthopedic Surgery

## 2016-02-09 DIAGNOSIS — M722 Plantar fascial fibromatosis: Secondary | ICD-10-CM | POA: Diagnosis not present

## 2016-02-09 DIAGNOSIS — Z87891 Personal history of nicotine dependence: Secondary | ICD-10-CM | POA: Insufficient documentation

## 2016-02-09 DIAGNOSIS — I10 Essential (primary) hypertension: Secondary | ICD-10-CM | POA: Insufficient documentation

## 2016-02-09 DIAGNOSIS — R001 Bradycardia, unspecified: Secondary | ICD-10-CM | POA: Insufficient documentation

## 2016-02-09 DIAGNOSIS — Z79899 Other long term (current) drug therapy: Secondary | ICD-10-CM | POA: Diagnosis not present

## 2016-02-09 HISTORY — DX: Plantar fascial fibromatosis: M72.2

## 2016-02-09 HISTORY — DX: Adverse effect of unspecified anesthetic, initial encounter: T41.45XA

## 2016-02-09 HISTORY — DX: Nausea with vomiting, unspecified: R11.2

## 2016-02-09 HISTORY — DX: Other complications of anesthesia, initial encounter: T88.59XA

## 2016-02-09 HISTORY — DX: Hypothyroidism, unspecified: E03.9

## 2016-02-09 HISTORY — PX: PLANTAR FASCIA RELEASE: SHX2239

## 2016-02-09 HISTORY — DX: Other specified postprocedural states: Z98.890

## 2016-02-09 SURGERY — FASCIOTOMY, PLANTAR, ENDOSCOPIC
Anesthesia: General | Site: Foot | Laterality: Left

## 2016-02-09 MED ORDER — PROPOFOL 500 MG/50ML IV EMUL
INTRAVENOUS | Status: AC
  Filled 2016-02-09: qty 50

## 2016-02-09 MED ORDER — LACTATED RINGERS IV SOLN
INTRAVENOUS | Status: DC
  Administered 2016-02-09: 10 mL/h via INTRAVENOUS

## 2016-02-09 MED ORDER — CHLORHEXIDINE GLUCONATE 4 % EX LIQD: 60.0000 mL | Freq: Once | CUTANEOUS | Status: DC

## 2016-02-09 MED ORDER — MEPERIDINE HCL 25 MG/ML IJ SOLN: 6.2500 mg | INTRAMUSCULAR | Status: DC | PRN

## 2016-02-09 MED ORDER — LIDOCAINE HCL (CARDIAC) 20 MG/ML IV SOLN
INTRAVENOUS | Status: DC | PRN
  Administered 2016-02-09: 50 mg via INTRAVENOUS

## 2016-02-09 MED ORDER — SCOPOLAMINE 1 MG/3DAYS TD PT72
MEDICATED_PATCH | TRANSDERMAL | Status: AC
  Filled 2016-02-09: qty 1

## 2016-02-09 MED ORDER — MIDAZOLAM HCL 2 MG/2ML IJ SOLN
1.0000 mg | INTRAMUSCULAR | Status: DC | PRN
  Administered 2016-02-09: 2 mg via INTRAVENOUS

## 2016-02-09 MED ORDER — BUPIVACAINE HCL (PF) 0.5 % IJ SOLN
INTRAMUSCULAR | Status: AC
  Filled 2016-02-09: qty 90

## 2016-02-09 MED ORDER — SCOPOLAMINE 1 MG/3DAYS TD PT72
1.0000 | MEDICATED_PATCH | Freq: Once | TRANSDERMAL | Status: DC | PRN
  Administered 2016-02-09: 1.5 mg via TRANSDERMAL

## 2016-02-09 MED ORDER — LACTATED RINGERS IV SOLN: INTRAVENOUS | Status: DC

## 2016-02-09 MED ORDER — CLINDAMYCIN PHOSPHATE 900 MG/50ML IV SOLN
900.0000 mg | INTRAVENOUS | Status: AC
  Administered 2016-02-09: 900 mg via INTRAVENOUS

## 2016-02-09 MED ORDER — MIDAZOLAM HCL 2 MG/2ML IJ SOLN
INTRAMUSCULAR | Status: AC
  Filled 2016-02-09: qty 2

## 2016-02-09 MED ORDER — DEXAMETHASONE SODIUM PHOSPHATE 4 MG/ML IJ SOLN
INTRAMUSCULAR | Status: DC | PRN
  Administered 2016-02-09: 10 mg via INTRAVENOUS

## 2016-02-09 MED ORDER — ONDANSETRON HCL 4 MG/2ML IJ SOLN
INTRAMUSCULAR | Status: AC
  Filled 2016-02-09: qty 2

## 2016-02-09 MED ORDER — ONDANSETRON HCL 4 MG PO TABS: 4.0000 mg | ORAL_TABLET | Freq: Three times a day (TID) | ORAL | Status: DC | PRN

## 2016-02-09 MED ORDER — FENTANYL CITRATE (PF) 100 MCG/2ML IJ SOLN
50.0000 ug | INTRAMUSCULAR | Status: DC | PRN
Start: 2016-02-09 — End: 2016-02-09
  Administered 2016-02-09: 100 ug via INTRAVENOUS

## 2016-02-09 MED ORDER — CLINDAMYCIN PHOSPHATE 900 MG/50ML IV SOLN
INTRAVENOUS | Status: AC
  Filled 2016-02-09: qty 50

## 2016-02-09 MED ORDER — DEXAMETHASONE SODIUM PHOSPHATE 10 MG/ML IJ SOLN
INTRAMUSCULAR | Status: AC
  Filled 2016-02-09: qty 1

## 2016-02-09 MED ORDER — LIDOCAINE HCL (CARDIAC) 20 MG/ML IV SOLN
INTRAVENOUS | Status: AC
  Filled 2016-02-09: qty 5

## 2016-02-09 MED ORDER — HYDROMORPHONE HCL 2 MG PO TABS
2.0000 mg | ORAL_TABLET | ORAL | Status: DC | PRN
Start: 2016-02-09 — End: 2016-08-17

## 2016-02-09 MED ORDER — GLYCOPYRROLATE 0.2 MG/ML IJ SOLN
0.2000 mg | Freq: Once | INTRAMUSCULAR | Status: AC | PRN
  Administered 2016-02-09: 0.2 mg via INTRAVENOUS

## 2016-02-09 MED ORDER — ONDANSETRON HCL 4 MG/2ML IJ SOLN
INTRAMUSCULAR | Status: DC | PRN
  Administered 2016-02-09: 4 mg via INTRAVENOUS

## 2016-02-09 MED ORDER — PROPOFOL 10 MG/ML IV BOLUS
INTRAVENOUS | Status: DC | PRN
  Administered 2016-02-09: 170 mg via INTRAVENOUS

## 2016-02-09 MED ORDER — FENTANYL CITRATE (PF) 100 MCG/2ML IJ SOLN
INTRAMUSCULAR | Status: AC
  Filled 2016-02-09: qty 2

## 2016-02-09 MED ORDER — METOCLOPRAMIDE HCL 5 MG/ML IJ SOLN: 10.0000 mg | Freq: Once | INTRAMUSCULAR | Status: DC | PRN

## 2016-02-09 MED ORDER — FENTANYL CITRATE (PF) 100 MCG/2ML IJ SOLN: 25.0000 ug | INTRAMUSCULAR | Status: DC | PRN

## 2016-02-09 MED ORDER — BUPIVACAINE HCL (PF) 0.5 % IJ SOLN
INTRAMUSCULAR | Status: DC | PRN
Start: 2016-02-09 — End: 2016-02-09
  Administered 2016-02-09: 10 mL

## 2016-02-09 SURGICAL SUPPLY — 48 items
APPLICATOR COTTON TIP 6IN STRL (MISCELLANEOUS) ×10 IMPLANT
BANDAGE ACE 3X5.8 VEL STRL LF (GAUZE/BANDAGES/DRESSINGS) ×1 IMPLANT
BANDAGE ACE 4X5 VEL STRL LF (GAUZE/BANDAGES/DRESSINGS) ×1 IMPLANT
BANDAGE ESMARK 6X9 LF (GAUZE/BANDAGES/DRESSINGS) ×1 IMPLANT
BLADE CUTTER GATOR 3.5 (BLADE) ×2 IMPLANT
BLADE GREAT WHITE 4.2 (BLADE) IMPLANT
BLADE SURG 15 STRL LF DISP TIS (BLADE) ×1 IMPLANT
BLADE SURG 15 STRL SS (BLADE) ×2
BLADE TRIANGLE EPF/EGR ENDO (BLADE) ×2 IMPLANT
BNDG CMPR 9X6 STRL LF SNTH (GAUZE/BANDAGES/DRESSINGS) ×1
BNDG COHESIVE 4X5 TAN STRL (GAUZE/BANDAGES/DRESSINGS) ×2 IMPLANT
BNDG ESMARK 6X9 LF (GAUZE/BANDAGES/DRESSINGS) ×2
BNDG GAUZE ELAST 4 BULKY (GAUZE/BANDAGES/DRESSINGS) ×2 IMPLANT
BUR CUDA 2.9 (BURR) IMPLANT
BUR GATOR 2.9 (BURR) IMPLANT
CANISTER SUCT 1200ML W/VALVE (MISCELLANEOUS) ×2 IMPLANT
COVER BACK TABLE 60X90IN (DRAPES) ×2 IMPLANT
CUFF TOURNIQUET SINGLE 18IN (TOURNIQUET CUFF) IMPLANT
CUFF TOURNIQUET SINGLE 34IN LL (TOURNIQUET CUFF) IMPLANT
DECANTER SPIKE VIAL GLASS SM (MISCELLANEOUS) IMPLANT
DRAPE EXTREMITY T 121X128X90 (DRAPE) ×2 IMPLANT
DRAPE IMP U-DRAPE 54X76 (DRAPES) ×2 IMPLANT
DRAPE OEC MINIVIEW 54X84 (DRAPES) ×2 IMPLANT
DRAPE U-SHAPE 47X51 STRL (DRAPES) ×2 IMPLANT
DURAPREP 26ML APPLICATOR (WOUND CARE) ×2 IMPLANT
GAUZE SPONGE 4X4 12PLY STRL (GAUZE/BANDAGES/DRESSINGS) ×2 IMPLANT
GAUZE XEROFORM 1X8 LF (GAUZE/BANDAGES/DRESSINGS) ×2 IMPLANT
GLOVE BIO SURGEON STRL SZ 6.5 (GLOVE) ×1 IMPLANT
GLOVE BIOGEL PI IND STRL 7.0 (GLOVE) ×1 IMPLANT
GLOVE BIOGEL PI INDICATOR 7.0 (GLOVE) ×4
GLOVE ECLIPSE 7.0 STRL STRAW (GLOVE) ×2 IMPLANT
GLOVE SURG ORTHO 8.0 STRL STRW (GLOVE) ×2 IMPLANT
GOWN STRL REUS W/ TWL LRG LVL3 (GOWN DISPOSABLE) ×2 IMPLANT
GOWN STRL REUS W/ TWL XL LVL3 (GOWN DISPOSABLE) ×1 IMPLANT
GOWN STRL REUS W/TWL LRG LVL3 (GOWN DISPOSABLE) ×2
GOWN STRL REUS W/TWL XL LVL3 (GOWN DISPOSABLE) ×6 IMPLANT
NEEDLE HYPO 22GX1.5 SAFETY (NEEDLE) ×1 IMPLANT
NS IRRIG 1000ML POUR BTL (IV SOLUTION) IMPLANT
PACK BASIN DAY SURGERY FS (CUSTOM PROCEDURE TRAY) ×2 IMPLANT
PADDING CAST ABS 3INX4YD NS (CAST SUPPLIES) ×1
PADDING CAST ABS COTTON 3X4 (CAST SUPPLIES) ×1 IMPLANT
STOCKINETTE 6  STRL (DRAPES) ×1
STOCKINETTE 6 STRL (DRAPES) ×1 IMPLANT
SUT ETHILON 3 0 PS 1 (SUTURE) ×2 IMPLANT
SYR BULB 3OZ (MISCELLANEOUS) ×2 IMPLANT
SYR CONTROL 10ML LL (SYRINGE) ×1 IMPLANT
TUBE CONNECTING 20X1/4 (TUBING) ×2 IMPLANT
UNDERPAD 30X30 (UNDERPADS AND DIAPERS) ×2 IMPLANT

## 2016-02-09 NOTE — Anesthesia Postprocedure Evaluation (Signed)
Anesthesia Post Note  Patient: Debra Barry  Procedure(s) Performed: Procedure(s) (LRB): LEFT FOOT ENDOSCOPIC PLANTAR FASCIOTOMY (Left)  Patient location during evaluation: PACU Anesthesia Type: General Level of consciousness: awake and alert Pain management: pain level controlled Vital Signs Assessment: post-procedure vital signs reviewed and stable Respiratory status: spontaneous breathing, nonlabored ventilation, respiratory function stable and patient connected to nasal cannula oxygen Cardiovascular status: blood pressure returned to baseline and stable Postop Assessment: no signs of nausea or vomiting Anesthetic complications: no    Last Vitals:  Filed Vitals:   02/09/16 1045 02/09/16 1131  BP: 165/56 156/57  Pulse: 56 56  Temp:  36.5 C  Resp: 12 16    Last Pain:  Filed Vitals:   02/09/16 1132  PainSc: 0-No pain                 Montez Hageman

## 2016-02-09 NOTE — Discharge Instructions (Signed)
Weight bearing as tolerated but must be in post-op shoe.  Do not remove bandage.  Do not get bandage wet.  Follow up appointment in one week  [place] IF: You have swelling of your calf or leg.  You develop shortness of breath or chest pain.  You have redness, swelling, or increasing pain in the wound.  There is pus or any unusual drainage coming from the surgical site.  You notice a bad smell coming from the surgical site or dressing.  The surgical site breaks open after sutures or staples have been removed.  There is persistent bleeding from the suture or staple line.  You are getting worse or are not improving.  You have any other questions or concerns.  SEEK IMMEDIATE MEDICAL CARE IF:  You have a fever.  You develop a rash.  You have difficulty breathing.  You develop any reaction or side effects to medicines given.  Your knee motion is decreasing rather than improving.  MAKE SURE YOU:  Understand these instructions.  Will watch your condition.  Will get help right away if you are not doing well or get worse.     Post Anesthesia Home Care Instructions  Activity: Get plenty of rest for the remainder of the day. A responsible adult should stay with you for 24 hours following the procedure.  For the next 24 hours, DO NOT: -Drive a car -Paediatric nurse -Drink alcoholic beverages -Take any medication unless instructed by your physician -Make any legal decisions or sign important papers.  Meals: Start with liquid foods such as gelatin or soup. Progress to regular foods as tolerated. Avoid greasy, spicy, heavy foods. If nausea and/or vomiting occur, drink only clear liquids until the nausea and/or vomiting subsides. Call your physician if vomiting continues.  Special Instructions/Symptoms: Your throat may feel dry or sore from the anesthesia or the breathing tube placed in your throat during surgery. If this causes discomfort, gargle with warm salt water. The  discomfort should disappear within 24 hours.  If you had a scopolamine patch placed behind your ear for the management of post- operative nausea and/or vomiting:  1. The medication in the patch is effective for 72 hours, after which it should be removed.  Wrap patch in a tissue and discard in the trash. Wash hands thoroughly with soap and water. 2. You may remove the patch earlier than 72 hours if you experience unpleasant side effects which may include dry mouth, dizziness or visual disturbances. 3. Avoid touching the patch. Wash your hands with soap and water after contact with the patch.

## 2016-02-09 NOTE — Anesthesia Preprocedure Evaluation (Addendum)
Anesthesia Evaluation  Patient identified by MRN, date of birth, ID band Patient awake    Reviewed: Allergy & Precautions, NPO status , Patient's Chart, lab work & pertinent test results  History of Anesthesia Complications (+) PONV  Airway Mallampati: II  TM Distance: >3 FB Neck ROM: Full    Dental no notable dental hx.    Pulmonary former smoker,    Pulmonary exam normal breath sounds clear to auscultation       Cardiovascular hypertension, Pt. on medications Normal cardiovascular exam Rhythm:Regular Rate:Normal     Neuro/Psych negative neurological ROS  negative psych ROS   GI/Hepatic negative GI ROS, Neg liver ROS,   Endo/Other  negative endocrine ROS  Renal/GU negative Renal ROS  negative genitourinary   Musculoskeletal negative musculoskeletal ROS (+)   Abdominal   Peds negative pediatric ROS (+)  Hematology negative hematology ROS (+)   Anesthesia Other Findings   Reproductive/Obstetrics negative OB ROS                            Anesthesia Physical Anesthesia Plan  ASA: II  Anesthesia Plan: General   Post-op Pain Management:    Induction: Intravenous  Airway Management Planned: LMA  Additional Equipment:   Intra-op Plan:   Post-operative Plan: Extubation in OR  Informed Consent: I have reviewed the patients History and Physical, chart, labs and discussed the procedure including the risks, benefits and alternatives for the proposed anesthesia with the patient or authorized representative who has indicated his/her understanding and acceptance.   Dental advisory given  Plan Discussed with: CRNA  Anesthesia Plan Comments:         Anesthesia Quick Evaluation

## 2016-02-09 NOTE — Interval H&P Note (Signed)
History and Physical Interval Note:  02/09/2016 7:33 AM  Debra Barry  has presented today for surgery, with the diagnosis of PLANTAR FASCIAL FIBROMATOSIS  The various methods of treatment have been discussed with the patient and family. After consideration of risks, benefits and other options for treatment, the patient has consented to  Procedure(s): LEFT FOOT ENDOSCOPIC PLANTAR FASCIOTOMY (Left) as a surgical intervention .  The patient's history has been reviewed, patient examined, no change in status, stable for surgery.  I have reviewed the patient's chart and labs.  Questions were answered to the patient's satisfaction.     [name]

## 2016-02-09 NOTE — Anesthesia Procedure Notes (Signed)
Procedure Name: LMA Insertion Performed by: Terrance Mass Pre-anesthesia Checklist: Patient identified, Emergency Drugs available, Suction available and Patient being monitored Patient Re-evaluated:Patient Re-evaluated prior to inductionOxygen Delivery Method: Circle System Utilized Preoxygenation: Pre-oxygenation with 100% oxygen Intubation Type: IV induction Ventilation: Mask ventilation without difficulty LMA: LMA inserted LMA Size: 4.0 Number of attempts: 1 Placement Confirmation: positive ETCO2 Tube secured with: Tape Dental Injury: Teeth and Oropharynx as per pre-operative assessment

## 2016-02-09 NOTE — Transfer of Care (Signed)
Immediate Anesthesia Transfer of Care Note  Patient: Debra Barry  Procedure(s) Performed: Procedure(s): LEFT FOOT ENDOSCOPIC PLANTAR FASCIOTOMY (Left)  Patient Location: PACU  Anesthesia Type:General  Level of Consciousness: awake and sedated  Airway & Oxygen Therapy: Patient Spontanous Breathing and Patient connected to face mask oxygen  Post-op Assessment: Report given to RN and Post -op Vital signs reviewed and stable  Post vital signs: Reviewed and stable  Last Vitals:  Filed Vitals:   02/09/16 0732  BP: 170/60  Pulse: 51  Temp: 36.6 C  Resp: 18    Complications: No apparent anesthesia complications

## 2016-02-10 ENCOUNTER — Encounter (HOSPITAL_BASED_OUTPATIENT_CLINIC_OR_DEPARTMENT_OTHER): Payer: Self-pay | Admitting: Orthopedic Surgery

## 2016-02-10 NOTE — Op Note (Signed)
NAME:  Debra Barry, Debra Barry NO.:  MEDICAL RECORD NO.:  [id number]  LOCATION:                                 FACILITY:  PHYSICIAN:  [name], M.D. DATE OF BIRTH:  1951/06/30  DATE OF PROCEDURE: DATE OF DISCHARGE:                              OPERATIVE REPORT   PREOPERATIVE DIAGNOSIS:  Longstanding recalcitrant plantar fasciitis, left heel.  POSTOPERATIVE DIAGNOSIS:  Longstanding recalcitrant plantar fasciitis, left heel.  PROCEDURE:  Endoscopic plantar fascia release.  SURGEON:  [name], MD  ASSISTANT:  Elmyra Ricks, PA  ANESTHESIA:  General.  BLOOD LOSS:  Minimal.  SPECIMENS:  None.  CULTURES:  None.  COMPLICATIONS:  None.  DRESSINGS:  Soft compressive.  TOURNIQUET TIME:  20 minutes.  PROCEDURE IN DETAIL:  The patient was brought to the operating room and after adequate anesthesia had been obtained, calf tourniquet applied, prepped and draped in usual sterile fashion.  Exsanguinated with elevation of Esmarch.  Tourniquet inflated to 250 mmHg.  Fluoroscopic guidance to isolate plantar fascia attached to the os calcis.  Stab wound medially and laterally.  Cannula was placed on the plantar side of the plantar fascia.  Under direct visualization, it was incised in its entirety medial to lateral protecting neurovascular structures. Markedly thickened throughout.  Nice complete release.  Wound irrigated. Injected with Marcaine.  Portals were closed with nylon.  Sterile compressive dressing applied.  Tourniquet deflated and removed. Anesthesia was reversed.  Brought to the recovery room.  Tolerated the surgery well, no complications.     [name], M.D.     DFM/MEDQ  D:  02/09/2016  T:  02/09/2016  Job:  [id number]

## 2016-02-17 DIAGNOSIS — M722 Plantar fascial fibromatosis: Secondary | ICD-10-CM | POA: Diagnosis not present

## 2016-02-24 DIAGNOSIS — M25572 Pain in left ankle and joints of left foot: Secondary | ICD-10-CM | POA: Diagnosis not present

## 2016-02-24 DIAGNOSIS — M722 Plantar fascial fibromatosis: Secondary | ICD-10-CM | POA: Diagnosis not present

## 2016-03-27 DIAGNOSIS — M25572 Pain in left ankle and joints of left foot: Secondary | ICD-10-CM | POA: Diagnosis not present

## 2016-04-10 DIAGNOSIS — H2513 Age-related nuclear cataract, bilateral: Secondary | ICD-10-CM | POA: Diagnosis not present

## 2016-04-10 DIAGNOSIS — H43813 Vitreous degeneration, bilateral: Secondary | ICD-10-CM | POA: Diagnosis not present

## 2016-04-23 DIAGNOSIS — Z6826 Body mass index (BMI) 26.0-26.9, adult: Secondary | ICD-10-CM | POA: Diagnosis not present

## 2016-04-23 DIAGNOSIS — I10 Essential (primary) hypertension: Secondary | ICD-10-CM | POA: Diagnosis not present

## 2016-04-25 DIAGNOSIS — M25572 Pain in left ankle and joints of left foot: Secondary | ICD-10-CM | POA: Diagnosis not present

## 2016-05-08 DIAGNOSIS — L7211 Pilar cyst: Secondary | ICD-10-CM | POA: Diagnosis not present

## 2016-05-09 DIAGNOSIS — Z6826 Body mass index (BMI) 26.0-26.9, adult: Secondary | ICD-10-CM | POA: Diagnosis not present

## 2016-05-09 DIAGNOSIS — I10 Essential (primary) hypertension: Secondary | ICD-10-CM | POA: Diagnosis not present

## 2016-05-22 DIAGNOSIS — Z6824 Body mass index (BMI) 24.0-24.9, adult: Secondary | ICD-10-CM | POA: Diagnosis not present

## 2016-05-22 DIAGNOSIS — I10 Essential (primary) hypertension: Secondary | ICD-10-CM | POA: Diagnosis not present

## 2016-06-01 DIAGNOSIS — Z1389 Encounter for screening for other disorder: Secondary | ICD-10-CM | POA: Diagnosis not present

## 2016-06-01 DIAGNOSIS — Z6824 Body mass index (BMI) 24.0-24.9, adult: Secondary | ICD-10-CM | POA: Diagnosis not present

## 2016-06-01 DIAGNOSIS — Z23 Encounter for immunization: Secondary | ICD-10-CM | POA: Diagnosis not present

## 2016-06-01 DIAGNOSIS — I10 Essential (primary) hypertension: Secondary | ICD-10-CM | POA: Diagnosis not present

## 2016-06-01 DIAGNOSIS — Z9181 History of falling: Secondary | ICD-10-CM | POA: Diagnosis not present

## 2016-06-04 ENCOUNTER — Other Ambulatory Visit: Payer: Self-pay | Admitting: Family Medicine

## 2016-06-04 DIAGNOSIS — I159 Secondary hypertension, unspecified: Secondary | ICD-10-CM

## 2016-06-05 DIAGNOSIS — E890 Postprocedural hypothyroidism: Secondary | ICD-10-CM | POA: Diagnosis not present

## 2016-06-05 DIAGNOSIS — R7303 Prediabetes: Secondary | ICD-10-CM | POA: Diagnosis not present

## 2016-06-05 DIAGNOSIS — E782 Mixed hyperlipidemia: Secondary | ICD-10-CM | POA: Diagnosis not present

## 2016-06-08 DIAGNOSIS — N183 Chronic kidney disease, stage 3 (moderate): Secondary | ICD-10-CM | POA: Diagnosis not present

## 2016-06-08 DIAGNOSIS — Z139 Encounter for screening, unspecified: Secondary | ICD-10-CM | POA: Diagnosis not present

## 2016-06-08 DIAGNOSIS — Z6824 Body mass index (BMI) 24.0-24.9, adult: Secondary | ICD-10-CM | POA: Diagnosis not present

## 2016-06-08 DIAGNOSIS — R7303 Prediabetes: Secondary | ICD-10-CM | POA: Diagnosis not present

## 2016-06-08 DIAGNOSIS — E782 Mixed hyperlipidemia: Secondary | ICD-10-CM | POA: Diagnosis not present

## 2016-06-08 DIAGNOSIS — I10 Essential (primary) hypertension: Secondary | ICD-10-CM | POA: Diagnosis not present

## 2016-06-08 DIAGNOSIS — E890 Postprocedural hypothyroidism: Secondary | ICD-10-CM | POA: Diagnosis not present

## 2016-06-08 DIAGNOSIS — Z0000 Encounter for general adult medical examination without abnormal findings: Secondary | ICD-10-CM | POA: Diagnosis not present

## 2016-06-15 ENCOUNTER — Other Ambulatory Visit: Payer: Commercial Managed Care - HMO

## 2016-06-15 ENCOUNTER — Ambulatory Visit
Admission: RE | Admit: 2016-06-15 | Discharge: 2016-06-15 | Disposition: A | Discharge status: Home / Self Care | Payer: Commercial Managed Care - HMO | Source: Ambulatory Visit | Attending: Family Medicine | Admitting: Family Medicine

## 2016-06-15 DIAGNOSIS — I10 Essential (primary) hypertension: Secondary | ICD-10-CM | POA: Diagnosis not present

## 2016-06-15 DIAGNOSIS — I159 Secondary hypertension, unspecified: Secondary | ICD-10-CM

## 2016-08-01 DIAGNOSIS — Z23 Encounter for immunization: Secondary | ICD-10-CM | POA: Diagnosis not present

## 2016-08-07 DIAGNOSIS — Z6824 Body mass index (BMI) 24.0-24.9, adult: Secondary | ICD-10-CM | POA: Diagnosis not present

## 2016-08-07 DIAGNOSIS — K130 Diseases of lips: Secondary | ICD-10-CM | POA: Diagnosis not present

## 2016-08-17 ENCOUNTER — Encounter (INDEPENDENT_AMBULATORY_CARE_PROVIDER_SITE_OTHER): Payer: Self-pay

## 2016-08-17 ENCOUNTER — Encounter: Payer: Self-pay | Admitting: Internal Medicine

## 2016-08-17 ENCOUNTER — Ambulatory Visit (INDEPENDENT_AMBULATORY_CARE_PROVIDER_SITE_OTHER): Payer: Commercial Managed Care - HMO | Admitting: Internal Medicine

## 2016-08-17 VITALS — BP 140/72 | HR 58 | Ht 64.5 in | Wt 142.4 lb

## 2016-08-17 DIAGNOSIS — R079 Chest pain, unspecified: Secondary | ICD-10-CM | POA: Diagnosis not present

## 2016-08-17 DIAGNOSIS — R011 Cardiac murmur, unspecified: Secondary | ICD-10-CM | POA: Diagnosis not present

## 2016-08-17 DIAGNOSIS — R0602 Shortness of breath: Secondary | ICD-10-CM | POA: Diagnosis not present

## 2016-08-17 DIAGNOSIS — I10 Essential (primary) hypertension: Secondary | ICD-10-CM

## 2016-08-17 MED ORDER — ASPIRIN EC 81 MG PO TBEC: 81.0000 mg | DELAYED_RELEASE_TABLET | Freq: Every day | ORAL | Status: DC

## 2016-08-17 NOTE — Progress Notes (Addendum)
New Outpatient Visit Date: 08/17/2016  Referring Provider: Leonides Sake, MD [address] [place], [place] [address]  Chief Complaint: High blood pressure  HPI:  Debra Barry is a 65 y.o. year-old female with history of hypertension, hyperlipidemia, impaired glucose tolerance, hypothyroidism, and heart murmur, who has been referred by Dr. Lisbeth Ply for evaluation of labile blood pressure. The patient reports that her blood pressure had been well-controlled until about 1 year ago.  She notes that she has been going though a lot of stress recently, including the death of her mother and becoming the primary caretaker for her sister.  However, despite multiple medication adjustments, her blood pressure has remained suboptimally controlled.  She typically checks her blood pressure 3-4 times a day, with most of her readings showing upper normal to significantly elevated systolic blood pressures.  Interestingly, when her systolic blood pressure is low (130-140 mmHg), she develops palpitations with racing of the heart lasting a few minutes at a time.  She was previously on nifedipine, but notes that this was discontinued several months ago due to poor blood pressure control.  Notes from her PCP suggest that it may have also been held due to bradycardia.  The patient was also tried on HCTZ, but after 1-2 weeks of continued elevated blood pressure, this medication was also stopped.  Currently, the patient is on doxazosin and losartan for blood pressure control.  During the time that her blood pressure has been poorly controlled, the patient has also felt generally unwell with progressive fatigue, frequent nausea, and 15 pound weight loss. The patient also endorses occasional chest discomfort, which she associates with low blood pressures. This occurs about twice a week and seems to be related to stress. She notes that on a vacation this summer at [facility], she also had chest pressure and shortness of breath  while walking for extended periods of time. The pain is substernal without radiation. It resolves on its own in a few minutes. She denies symptoms at rest. Ms. Vonstein endorses chronic 2 pillow orthopnea without lower extremity edema. The patient has been told that she snores and does not sleep well many nights. She has not undergone previous cardiovascular workup or sleep study, with the exception of negative renal artery Doppler ultrasound in August.  --------------------------------------------------------------------------------------------------  Cardiovascular History & Procedures: Cardiovascular Problems:  Chest pain and shortness of breath  Labile hypertension  Risk Factors:  Hypertension and age  Cath/PCI:  None  CV Surgery:  None  EP Procedures and Devices:  None  Non-Invasive Evaluation(s):  Renal artery Doppler (06/15/16): No evidence of renal artery stenosis or hydronephrosis.  Possible right renal vascular calcification or non-obstructive stone.  Recent CV Pertinent Labs: See outside labs below.  --------------------------------------------------------------------------------------------------  Past Medical History:  Diagnosis Date  . Arthritis   . Complication of anesthesia   . Heart murmur   . Hx of adenomatous polyp of colon 08/11/2015  . Hyperlipidemia   . Hypertension   . Hypothyroidism   . Patella fracture right    2014  . Plantar fascial fibromatosis    left foot  . PONV (postoperative nausea and vomiting)     Past Surgical History:  Procedure Laterality Date  . APPENDECTOMY  1981  . COLON RESECTION  1985   benign tumor  . FOOT SURGERY Left 2003  . patella fracture repair Left 2007  . PLANTAR FASCIA RELEASE Left 02/09/2016   Procedure: LEFT FOOT ENDOSCOPIC PLANTAR FASCIOTOMY;  Surgeon: [name], MD;  Location:  [place];  Service: Orthopedics;  Laterality: Left;  . THYROIDECTOMY  1995  . [place]      Outpatient Encounter Prescriptions as of 08/17/2016  Medication Sig  . alendronate (FOSAMAX) 70 MG tablet Take 70 mg by mouth once a week. Take with a full glass of water on an empty stomach.  . doxazosin (CARDURA) 4 MG tablet Take 4 mg by mouth daily.  . fenofibrate 160 MG tablet Take 160 mg by mouth daily.  Marland Kitchen levothyroxine (SYNTHROID, LEVOTHROID) 100 MCG tablet Take 100 mcg by mouth daily.  Marland Kitchen losartan (COZAAR) 100 MG tablet Take 100 mg by mouth daily.  . multivitamin-lutein (OCUVITE-LUTEIN) CAPS capsule Take 1 capsule by mouth daily.  . Polyvinyl Alcohol-Povidone (REFRESH OP) Apply 1 drop to eye 4 (four) times daily as needed (dry eyes).   Marland Kitchen aspirin EC 81 MG tablet Take 1 tablet (81 mg total) by mouth daily.  . [DISCONTINUED] calcium carbonate (OSCAL) 1500 (600 CA) MG TABS tablet Take by mouth 2 (two) times daily with a meal. Reported on 11/30/2015  . [DISCONTINUED] FLUOCINONIDE EX Apply topically. Reported on 11/30/2015  . [DISCONTINUED] HYDROmorphone (DILAUDID) 2 MG tablet Take 1 tablet (2 mg total) by mouth every 4 (four) hours as needed for severe pain.  . [DISCONTINUED] levothyroxine (SYNTHROID, LEVOTHROID) 88 MCG tablet Take 88 mcg by mouth daily before breakfast.  . [DISCONTINUED] NIFEdipine (PROCARDIA-XL/ADALAT CC) 30 MG 24 hr tablet Take 30 mg by mouth daily.  . [DISCONTINUED] ondansetron (ZOFRAN) 4 MG tablet Take 1 tablet (4 mg total) by mouth every 8 (eight) hours as needed for nausea or vomiting.   No facility-administered encounter medications on file as of 08/17/2016.     Allergies: Codeine; Penicillins; Tricor [fenofibrate]; Verapamil; and Sulfa antibiotics  Social History   Social History  . Marital status: Divorced    Spouse name: N/A  . Number of children: N/A  . Years of education: N/A   Occupational History  . Not on file.   Social History Main Topics  . Smoking status: Former Smoker    Packs/day: 0.25    Types: Cigarettes    Quit date: 10/22/1974  .  Smokeless tobacco: Never Used  . Alcohol use No  . Drug use: No  . Sexual activity: Not on file   Other Topics Concern  . Not on file   Social History Narrative  . No narrative on file    Family History  Problem Relation Age of Onset  . Rectal cancer Father   . Hepatitis Father   . Hypertension Mother   . Hyperlipidemia Mother   . Glaucoma Mother   . Stroke Mother     TIA  . Heart disease Maternal Grandfather   . Colon cancer Neg Hx     Review of Systems: A 12-system review of systems was performed and was negative except as noted in the HPI.  --------------------------------------------------------------------------------------------------  Physical Exam: BP 140/72   Pulse (!) 58   Ht 5' 4.5" (1.638 m)   Wt 142 lb 6.4 oz (64.6 kg)   LMP  (LMP Unknown)   BMI 24.07 kg/m   General:  Well-developed woman, seated comfortably on the exam table. HEENT: No conjunctival pallor or scleral icterus.  Moist mucous membranes.  OP clear. Neck: Supple without lymphadenopathy, thyromegaly, JVD, or HJR.  No carotid bruit. Lungs: Normal work of breathing.  Clear to auscultation bilaterally without wheezes or crackles. Heart: Regular rate and rhythm 3/6 systolic murmur loudest at  the right upper sternal border radiating to the carotid arteries.  Normal S1 and S2.  Non-displaced PMI. Abd: Bowel sounds present.  Soft, NT/ND without hepatosplenomegaly Ext: No lower extremity edema.  Radial, PT, and DP pulses are 2+ bilaterally Skin: warm and dry without rash Neuro: CNIII-XII intact.  Strength and fine-touch sensation intact in upper and lower extremities bilaterally. Psych: Normal mood and affect.  EKG:  Sinus bradycardia (HR 58 bpm) with poor R-wave progression that may reflect lead placement or prior anterior infarct.  Outside labs (06/05/16): Sodium 144, potassium 3.9, chloride 104, CO2 24, BUN 22, creatinine 1.2, glucose 105, calcium 9.7, AST 24, ALT 13, alkaline phosphatase 19,  total bilirubin 0.4, total protein 6.3, albumin 4.2  TSH 0.786  White blood cell count 4.8, hemoglobin 11.0, hematocrit 33.3, platelets 198  Hemoglobin A1c 5.8  --------------------------------------------------------------------------------------------------  ASSESSMENT AND PLAN: 1. Hypertension Blood pressure is borderline elevated today, though her home readings are typically elevated.  She is currently on doxazosin and losartan.  Given her progressive fatigue, shortness of breath, and chest tightness in the setting of significant heart murmur and labile blood pressure, I would like to obtain a transthoracic echocardiogram before proceeding with further medication adjustments.  Ultimately, I think it would be beneficial to restart a thiazide diuretic, and possibly a dihydropyridine calcium channel blocker, in place of doxazosin. However, we will defer this until after the echo has been completed. Though resting heart rate precludes addition of a beta blocker at this time.  2. Chest pain and shortness of breath I am suspicious that Ms. Lizak's symptoms are related to underlying cardiac disease. Given the significant murmur on exam, I am concerned about valvular heart disease that may be leading to these symptoms. Concurrent coronary artery disease is also a consideration. Our first step will be to obtain a transthoracic echocardiogram. If there is severe valvular disease, we will proceed with left and right heart catheterization. Otherwise, we will perform further ischemia evaluation with myocardial perfusion stress test. I have asked the patient to start taking aspirin 81 mg daily. We will also discuss lipid panel when the patient returns for follow-up.  Follow-up: Return to clinic in 3 months. We will discuss further evaluation for CAD in the meantime by phone after her echo has been completed.  Nelva Bush, MD 08/18/2016 6:07 PM

## 2016-08-17 NOTE — Patient Instructions (Signed)
Medication Instructions:  Your physician has recommended you make the following change in your medication:  START Aspirin 81mg  daily   Labwork: None ordered  Testing/Procedures: Your physician has requested that you have an echocardiogram. Echocardiography is a painless test that uses sound waves to create images of your heart. It provides your doctor with information about the size and shape of your heart and how well your heart's chambers and valves are working. This procedure takes approximately one hour. There are no restrictions for this procedure. (Please schedule asap)  Follow-Up: Your physician recommends that you schedule a follow-up appointment in: 3 months with Dr.End   Any Other Special Instructions Will Be Listed Below (If Applicable).     If you need a refill on your cardiac medications before your next appointment, please call your pharmacy.

## 2016-08-18 ENCOUNTER — Encounter: Payer: Self-pay | Admitting: Internal Medicine

## 2016-08-20 ENCOUNTER — Telehealth: Payer: Self-pay | Admitting: Internal Medicine

## 2016-08-20 NOTE — Telephone Encounter (Signed)
Does not need an encounter

## 2016-08-27 ENCOUNTER — Other Ambulatory Visit: Payer: Self-pay

## 2016-08-27 ENCOUNTER — Ambulatory Visit (HOSPITAL_COMMUNITY): Payer: Commercial Managed Care - HMO | Attending: Cardiovascular Disease

## 2016-08-27 DIAGNOSIS — E785 Hyperlipidemia, unspecified: Secondary | ICD-10-CM | POA: Insufficient documentation

## 2016-08-27 DIAGNOSIS — Z87891 Personal history of nicotine dependence: Secondary | ICD-10-CM | POA: Diagnosis not present

## 2016-08-27 DIAGNOSIS — R0602 Shortness of breath: Secondary | ICD-10-CM | POA: Insufficient documentation

## 2016-08-27 DIAGNOSIS — I10 Essential (primary) hypertension: Secondary | ICD-10-CM | POA: Insufficient documentation

## 2016-08-27 DIAGNOSIS — I313 Pericardial effusion (noninflammatory): Secondary | ICD-10-CM | POA: Insufficient documentation

## 2016-08-27 DIAGNOSIS — I083 Combined rheumatic disorders of mitral, aortic and tricuspid valves: Secondary | ICD-10-CM | POA: Diagnosis not present

## 2016-08-27 DIAGNOSIS — R011 Cardiac murmur, unspecified: Secondary | ICD-10-CM | POA: Diagnosis not present

## 2016-08-27 LAB — ECHOCARDIOGRAM COMPLETE
AV Area VTI index: 1.25 cm2/m2
AV Area VTI: 2.23 cm2
AV Area mean vel: 2.37 cm2
AV Mean grad: 15 mmHg
AV Peak grad: 29 mmHg
AV area mean vel ind: 1.38 cm2/m2
AV peak Index: 1.3
AV pk vel: 269 cm/s
AVCELMEANRAT: 0.62
Ao pk vel: 0.59 m/s
CHL CUP AV VEL: 2.15
CHL CUP MV DEC (S): 187
CHL CUP RV SYS PRESS: 42 mmHg
DOP CAL AO MEAN VELOCITY: 181 cm/s
E decel time: 187 msec
E/e' ratio: 18.39
FS: 31 % (ref 28–44)
IVS/LV PW RATIO, ED: 0.81
LA diam end sys: 46 mm
LA vol A4C: 73 ml
LA vol index: 37.8 mL/m2
LADIAMINDEX: 2.67 cm/m2
LASIZE: 46 mm
LAVOL: 65.2 mL
LDCA: 3.8 cm2
LV E/e' medial: 18.39
LV E/e'average: 18.39
LV TDI E'LATERAL: 6.2
LV e' LATERAL: 6.2 cm/s
LVOT SV: 142 mL
LVOT VTI: 37.3 cm
LVOT peak grad rest: 10 mmHg
LVOTD: 22 mm
LVOTPV: 158 cm/s
LVOTVTI: 0.57 cm
MV Peak grad: 5 mmHg
MV VTI: 211 cm
MVPKAVEL: 104 m/s
MVPKEVEL: 114 m/s
P 1/2 time: 635 ms
PW: 12.6 mm — AB (ref 0.6–1.1)
RV LATERAL S' VELOCITY: 15.4 cm/s
Reg peak vel: 314 cm/s
TAPSE: 30.9 mm
TDI e' medial: 6.31
TRMAXVEL: 314 cm/s
VTI: 66 cm
Valve area index: 1.25
Valve area: 2.15 cm2

## 2016-08-28 ENCOUNTER — Telehealth: Payer: Self-pay | Admitting: Internal Medicine

## 2016-08-28 DIAGNOSIS — R0789 Other chest pain: Secondary | ICD-10-CM

## 2016-08-28 DIAGNOSIS — R0602 Shortness of breath: Secondary | ICD-10-CM

## 2016-08-28 NOTE — Telephone Encounter (Signed)
Pt will be in 11/21 for stress test per her request and pt will f/u with Dr End 12/7 she can not come to office the last week of November

## 2016-08-28 NOTE — Telephone Encounter (Signed)
I spoke with patient regarding the results of her echocardiogram performed yesterday. This demonstrated normal biventricular function. There is mild to moderate aortic and mitral regurgitation as well as mild aortic stenosis. A small circumferential pericardial effusion was also identified without evidence of tamponade physiology. The patient continues to feel fatigued with shortness of breath and atypical chest pain. We have agreed to proceed with myocardial perfusion stress test and close follow-up in the office to review the results and consider adjustments to her blood pressure regimen.  Debra Bush, MD [facility] Pager: [phone]

## 2016-09-06 ENCOUNTER — Telehealth (HOSPITAL_COMMUNITY): Payer: Self-pay | Admitting: *Deleted

## 2016-09-06 NOTE — Telephone Encounter (Signed)
Patient given detailed instructions per Myocardial Perfusion Study Information Sheet for the test on 09/11/16. Patient notified to arrive 15 minutes early and that it is imperative to arrive on time for appointment to keep from having the test rescheduled.  If you need to cancel or reschedule your appointment, please call the office within 24 hours of your appointment. Failure to do so may result in a cancellation of your appointment, and a $50 no show fee. Patient verbalized understanding  Hubbard Robinson, RN

## 2016-09-11 ENCOUNTER — Ambulatory Visit (HOSPITAL_COMMUNITY): Payer: Commercial Managed Care - HMO | Attending: Cardiovascular Disease

## 2016-09-11 VITALS — Wt 142.0 lb

## 2016-09-11 DIAGNOSIS — R079 Chest pain, unspecified: Secondary | ICD-10-CM | POA: Diagnosis not present

## 2016-09-11 DIAGNOSIS — R0789 Other chest pain: Secondary | ICD-10-CM | POA: Diagnosis not present

## 2016-09-11 DIAGNOSIS — R9439 Abnormal result of other cardiovascular function study: Secondary | ICD-10-CM | POA: Diagnosis not present

## 2016-09-11 DIAGNOSIS — R0602 Shortness of breath: Secondary | ICD-10-CM | POA: Diagnosis not present

## 2016-09-11 DIAGNOSIS — Z8249 Family history of ischemic heart disease and other diseases of the circulatory system: Secondary | ICD-10-CM | POA: Insufficient documentation

## 2016-09-11 DIAGNOSIS — R001 Bradycardia, unspecified: Secondary | ICD-10-CM | POA: Diagnosis not present

## 2016-09-11 DIAGNOSIS — R110 Nausea: Secondary | ICD-10-CM

## 2016-09-11 DIAGNOSIS — R5383 Other fatigue: Secondary | ICD-10-CM | POA: Diagnosis not present

## 2016-09-11 LAB — MYOCARDIAL PERFUSION IMAGING
CHL CUP NUCLEAR SSS: 5
CSEPPHR: 113 {beats}/min
LV sys vol: 54 mL
LVDIAVOL: 127 mL (ref 46–106)
NUC STRESS TID: 0.93
RATE: 0.17
Rest HR: 49 {beats}/min
SDS: 5
SRS: 0

## 2016-09-11 MED ORDER — AMINOPHYLLINE 25 MG/ML IV SOLN
75.0000 mg | Freq: Once | INTRAVENOUS | Status: AC
  Administered 2016-09-11: 75 mg via INTRAVENOUS

## 2016-09-11 MED ORDER — REGADENOSON 0.4 MG/5ML IV SOLN
0.4000 mg | Freq: Once | INTRAVENOUS | Status: AC
  Administered 2016-09-11: 0.4 mg via INTRAVENOUS

## 2016-09-11 MED ORDER — TECHNETIUM TC 99M TETROFOSMIN IV KIT
32.3000 | PACK | Freq: Once | INTRAVENOUS | Status: AC | PRN
  Administered 2016-09-11: 32.3 via INTRAVENOUS
  Filled 2016-09-11: qty 33

## 2016-09-11 MED ORDER — TECHNETIUM TC 99M TETROFOSMIN IV KIT
10.2000 | PACK | Freq: Once | INTRAVENOUS | Status: AC | PRN
  Administered 2016-09-11: 10.2 via INTRAVENOUS
  Filled 2016-09-11: qty 11

## 2016-09-12 ENCOUNTER — Telehealth: Payer: Self-pay | Admitting: Internal Medicine

## 2016-09-12 MED ORDER — CHLORTHALIDONE 25 MG PO TABS: 25.0000 mg | ORAL_TABLET | Freq: Every day | ORAL | 5 refills | Status: DC

## 2016-09-12 NOTE — Telephone Encounter (Signed)
I spoke with the patient regarding the results of her myocardial perfusion stress test.  The test was low risk with partially reversible mid anterior defect.  The patient reports only occasional chest pain when lying on her back or when her blood pressure is significantly elevated.  Home BP has remained elevated, ranging 145-170/65-79 over the last day.  We have discussed treatment options and have agreed to start chlorthalidone 25 mg daily.  We will recheck her BP when she returns to see me on 12/7.  We will also obtain BMP and lipid panel at that time.  Nelva Bush, MD [facility] Pager: [phone]

## 2016-09-26 ENCOUNTER — Telehealth: Payer: Self-pay | Admitting: Internal Medicine

## 2016-09-26 DIAGNOSIS — I119 Hypertensive heart disease without heart failure: Secondary | ICD-10-CM

## 2016-09-26 NOTE — Progress Notes (Deleted)
Follow-up Outpatient Visit Date: 09/27/2016  Chief Complaint: Follow-up chest pain, shortness of breath, and elevated blood pressure  HPI:  Debra Barry is a 65 y.o. year-old female with history of hypertension, hyperlipidemia, impaired glucose tolerance, hypothyroidism, and heart murmur, who presents for follow-up of atypical chest pain, shortness of breath, and elevated blood pressure. I last saw the patient on 08/17/16. At that time, she reported labile blood pressures over the last year with multiple medication changes by her PCP. She also reported generalized malaise with accompanying chest discomfort, shortness of breath, nausea, and 15 pound weight loss. We performed a transthoracic echocardiogram and myocardial perfusion stress test. Echo showed normal LV size and systolic function with grade 2 diastolic dysfunction and mild to moderate aortic and mitral regurgitation. Mild to moderate pulmonary hypertension was also evident. Stress test revealed a small, partially reversible mid anterior defect with normal LVEF, consistent with low risk study. The patient reported that her blood pressures at home remained elevated. We therefore agreed to start chlorthalidone 25 mg daily to better optimize her blood pressure.  --------------------------------------------------------------------------------------------------   Cardiovascular History & Procedures: Cardiovascular Problems:  Chest pain and shortness of breath  Labile hypertension  Risk Factors:  Hypertension and age  Cath/PCI:  None  CV Surgery:  None  EP Procedures and Devices:  None  Non-Invasive Evaluation(s):  Transthoracic echocardiogram (08/27/16): Normal LV size and wall thickness. LVEF 60-65%. Grade 2 diastolic dysfunction. Aortic valve is mildly thickened without stenosis. There is mild to moderate regurgitation. Mitral annular calcification with mild to moderate regurgitation. Moderately dilated left atrium. Normal RV  size and function. Mild TR. Mildly elevated pulmonary pressure. Small circumferential pericardial effusion without evidence of hemodynamic compromise.  Renal artery Doppler (06/15/16): No evidence of renal artery stenosis or hydronephrosis.  Possible right renal vascular calcification or non-obstructive stone.  Recent CV Pertinent Labs: See outside labs below.  Past medical and surgical history were reviewed and updated in EPIC.  Outpatient Encounter Prescriptions as of 09/27/2016  Medication Sig  . alendronate (FOSAMAX) 70 MG tablet Take 70 mg by mouth once a week. Take with a full glass of water on an empty stomach.  Marland Kitchen aspirin EC 81 MG tablet Take 1 tablet (81 mg total) by mouth daily.  . chlorthalidone (HYGROTON) 25 MG tablet Take 1 tablet (25 mg total) by mouth daily.  Marland Kitchen doxazosin (CARDURA) 4 MG tablet Take 4 mg by mouth daily.  . fenofibrate 160 MG tablet Take 160 mg by mouth daily.  Marland Kitchen levothyroxine (SYNTHROID, LEVOTHROID) 100 MCG tablet Take 100 mcg by mouth daily.  Marland Kitchen losartan (COZAAR) 100 MG tablet Take 100 mg by mouth daily.  . multivitamin-lutein (OCUVITE-LUTEIN) CAPS capsule Take 1 capsule by mouth daily.  . Polyvinyl Alcohol-Povidone (REFRESH OP) Apply 1 drop to eye 4 (four) times daily as needed (dry eyes).    No facility-administered encounter medications on file as of 09/27/2016.     Allergies: Codeine; Penicillins; Tricor [fenofibrate]; Verapamil; and Sulfa antibiotics  Social History   Social History  . Marital status: Divorced    Spouse name: N/A  . Number of children: N/A  . Years of education: N/A   Occupational History  . Not on file.   Social History Main Topics  . Smoking status: Former Smoker    Packs/day: 0.25    Types: Cigarettes    Quit date: 10/22/1974  . Smokeless tobacco: Never Used  . Alcohol use No  . Drug use: No  . Sexual activity: Not on  file   Other Topics Concern  . Not on file   Social History Narrative  . No narrative on file     Family History  Problem Relation Age of Onset  . Rectal cancer Father   . Hepatitis Father   . Hypertension Mother   . Hyperlipidemia Mother   . Glaucoma Mother   . Stroke Mother     TIA  . Heart disease Maternal Grandfather   . Colon cancer Neg Hx     Review of Systems: A 12-system review of systems was performed and was negative except as noted in the HPI.  --------------------------------------------------------------------------------------------------  Physical Exam: LMP  (LMP Unknown)   General:  *** HEENT: No conjunctival pallor or scleral icterus.  Moist mucous membranes.  OP clear. Neck: Supple without lymphadenopathy, thyromegaly, JVD, or HJR.  No carotid bruit. Lungs: Normal work of breathing.  Clear to auscultation bilaterally without wheezes or crackles. Heart: Regular rate and rhythm without murmurs, rubs, or gallops.  Non-displaced PMI. Abd: Bowel sounds present.  Soft, NT/ND without hepatosplenomegaly Ext: No lower extremity edema.  Radial, PT, and DP pulses are 2+ bilaterally. Skin: warm and dry without rash  EKG:  ***  Outside labs (06/05/16): Sodium 144, potassium 3.9, chloride 104, CO2 24, BUN 22, creatinine 1.2, glucose 105, calcium 9.7, AST 24, ALT 13, alkaline phosphatase 19, total bilirubin 0.4, total protein 6.3, albumin 4.2  TSH 0.786  White blood cell count 4.8, hemoglobin 11.0, hematocrit 33.3, platelets 198  Hemoglobin A1c 5.8 --------------------------------------------------------------------------------------------------  ASSESSMENT AND PLAN: ***  Debra Bush, MD 09/26/2016 7:59 AM

## 2016-09-26 NOTE — Telephone Encounter (Signed)
New Message  Pt voiced she was under the impression she was suppose to have labs 12.7.17 and not see MD-End but there are no orders.  Pt voiced she's been dealing with her issue for 3 months and she cannot wait until 1.8.17 to see MD-End and an APP/PA will not work for her.  Pt voiced wanting someone to contact her regarding an appt/order for labs and scheduling to see MD-End prior too 1.8.17, MD-End's first available.  Please f/u with pt

## 2016-09-26 NOTE — Telephone Encounter (Signed)
Pt was scheduled to see Dr End for tomorrow 12/7 for 3 month follow-up, with labs at that visit (to check bmet and lipids).  Pts appt was at 0830 and was cancelled, for Dr End will be in a meeting at that time.  Pt states that Dr End advised for her to follow-up on 12/7 and have labs same time.  Pt states that scheduler called her and cancelled the appt, and didn't offer to reschedule with Dr End in the very near future.  Pt states scheduler offered for her to see an Extender, but informed her that Dr Darnelle Bos next available is 10/29/16.  Pt only wants to see Dr End, for he is familiar with her case, and she was originally scheduled to see him tomorrow. Pt states that she went and got records from her PCP with past lipid results, to give Dr End to review and compare too new results, once drawn.  Informed the pt that Dr End and RN are both out of the office today, but I will route them this message to them for further review of rescheduling the pt in the very near future, with labs same day, and follow-up with the pt thereafter.  Informed the pt that an Extender on Dr Darnelle Bos team can also see her, if rescheduling is an issue with Dr End in the near future.  Pt verbalized understanding and agrees with this plan.  Will place BMET and lipid order per Dr End, in the pts chart, and lab appt as well as follow-up appt will be made once recommendations are given.

## 2016-09-27 ENCOUNTER — Ambulatory Visit: Payer: Commercial Managed Care - HMO | Admitting: Internal Medicine

## 2016-09-27 ENCOUNTER — Encounter: Payer: Self-pay | Admitting: Internal Medicine

## 2016-09-27 ENCOUNTER — Ambulatory Visit (INDEPENDENT_AMBULATORY_CARE_PROVIDER_SITE_OTHER): Payer: Commercial Managed Care - HMO | Admitting: Internal Medicine

## 2016-09-27 VITALS — BP 144/64 | HR 56 | Ht 64.5 in | Wt 137.0 lb

## 2016-09-27 DIAGNOSIS — I313 Pericardial effusion (noninflammatory): Secondary | ICD-10-CM | POA: Diagnosis not present

## 2016-09-27 DIAGNOSIS — E785 Hyperlipidemia, unspecified: Secondary | ICD-10-CM

## 2016-09-27 DIAGNOSIS — I3139 Other pericardial effusion (noninflammatory): Secondary | ICD-10-CM

## 2016-09-27 DIAGNOSIS — R5383 Other fatigue: Secondary | ICD-10-CM

## 2016-09-27 DIAGNOSIS — I10 Essential (primary) hypertension: Secondary | ICD-10-CM | POA: Diagnosis not present

## 2016-09-27 LAB — LIPID PANEL
Cholesterol: 135 mg/dL (ref <200)
HDL: 37 mg/dL — ABNORMAL LOW (ref >50)
LDL CALC: 80 mg/dL (ref <100)
Total CHOL/HDL Ratio: 3.6 Ratio (ref <5.0)
Triglycerides: 89 mg/dL (ref <150)
VLDL: 18 mg/dL (ref <30)

## 2016-09-27 LAB — HEPATIC FUNCTION PANEL
ALT: 33 U/L — ABNORMAL HIGH (ref 6–29)
AST: 65 U/L — ABNORMAL HIGH (ref 10–35)
Albumin: 4.2 g/dL (ref 3.6–5.1)
Alkaline Phosphatase: 16 U/L — ABNORMAL LOW (ref 33–130)
BILIRUBIN DIRECT: 0.2 mg/dL (ref <=0.2)
BILIRUBIN INDIRECT: 0.4 mg/dL (ref 0.2–1.2)
TOTAL PROTEIN: 6.1 g/dL (ref 6.1–8.1)
Total Bilirubin: 0.6 mg/dL (ref 0.2–1.2)

## 2016-09-27 LAB — CBC WITH DIFFERENTIAL/PLATELET
BASOS PCT: 3 %
Basophils Absolute: 96 cells/uL (ref 0–200)
Eosinophils Absolute: 224 cells/uL (ref 15–500)
Eosinophils Relative: 7 %
HEMATOCRIT: 31.6 % — AB (ref 35.0–45.0)
HEMOGLOBIN: 10.2 g/dL — AB (ref 11.7–15.5)
LYMPHS ABS: 1056 {cells}/uL (ref 850–3900)
Lymphocytes Relative: 33 %
MCH: 30.4 pg (ref 27.0–33.0)
MCHC: 32.3 g/dL (ref 32.0–36.0)
MCV: 94 fL (ref 80.0–100.0)
MONO ABS: 288 {cells}/uL (ref 200–950)
MPV: 12 fL (ref 7.5–12.5)
Monocytes Relative: 9 %
NEUTROS PCT: 48 %
Neutro Abs: 1536 cells/uL (ref 1500–7800)
Platelets: 183 10*3/uL (ref 140–400)
RBC: 3.36 MIL/uL — AB (ref 3.80–5.10)
RDW: 13.2 % (ref 11.0–15.0)
WBC: 3.2 10*3/uL — AB (ref 3.8–10.8)

## 2016-09-27 LAB — BASIC METABOLIC PANEL
BUN: 43 mg/dL — AB (ref 7–25)
CHLORIDE: 105 mmol/L (ref 98–110)
CO2: 24 mmol/L (ref 20–31)
Calcium: 9.1 mg/dL (ref 8.6–10.4)
Creat: 1.91 mg/dL — ABNORMAL HIGH (ref 0.50–0.99)
GLUCOSE: 89 mg/dL (ref 65–99)
POTASSIUM: 3.8 mmol/L (ref 3.5–5.3)
Sodium: 138 mmol/L (ref 135–146)

## 2016-09-27 LAB — CK: Total CK: 1081 U/L — ABNORMAL HIGH (ref 7–177)

## 2016-09-27 MED ORDER — AMLODIPINE BESYLATE 5 MG PO TABS: 5.0000 mg | ORAL_TABLET | Freq: Every day | ORAL | 3 refills | Status: DC

## 2016-09-27 NOTE — Telephone Encounter (Signed)
I spoke with patient regarding her visit with me today. She should follow-up in the office at 8:30 this morning, as previously scheduled. She is in agreement with this. I will contact our triage nurses and schedulers to ensure that she is added back to the scheduled to see me.  Nelva Bush, MD [facility] Pager: [phone]

## 2016-09-27 NOTE — Patient Instructions (Addendum)
Your physician has recommended you make the following change in your medication:  STOP DOXAZOSIN  START AMLODIPINE 5 MG  [place] physician recommends that you return for lab work in:  TODAY   CBC BMET  LIPID LIVER ANA  SED RATE  CK   AND  CRP    Your physician recommends that you schedule a follow-up appointment in: AS  SCHEDULED

## 2016-09-27 NOTE — Progress Notes (Signed)
Follow-up Outpatient Visit Date: 09/27/2016  Chief Complaint: Follow-up hypertension, chest pain, and shortness of breath  HPI:  Debra Barry is a 65 y.o. year-old female with history of hypertension, hyperlipidemia, impaired glucose tolerance, hypothyroidism, and heart murmur, who presents for follow-up of hypertension, chest pain, shortness of breath. I first met Debra Barry on 08/17/16. After that visit, we initially proceeded with a transthoracic echocardiogram that demonstrated normal LV contraction and grade 2 diastolic dysfunction with mild to moderate aortic and mitral valve regurgitation. Mild pulmonary hypertension was also evident, as well as a small pericardial effusion. Subsequent myocardial perfusion stress test showed a small in size, moderate in severity, mid anterior defect with preserved LVEF, consistent with a low risk study. Given continued hypertension, we also added chlorthalidone 25 mg daily after her stress test.  Today, the patient reports that she has been feeling a little bit better since our previous visit. She continues to lose weight with a poor appetite. She has experienced some nausea with low blood pressures, though this has been less of an issue recently. Her blood pressures remain labile ranging from 409-735 systolic. She initially experienced headaches with chlorthalidone, though this resolved after a few days. She notes occasional palpitations when moving quickly, which are uncomfortable for her. However, she has not had any actual chest pain or shortness of breath. She is concerned about generalized fatigue that has been plaguing her for months. She denies orthopnea and PND. She has not had any recent fevers or URI symptoms.  --------------------------------------------------------------------------------------------------  Cardiovascular History & Procedures: Cardiovascular Problems:  Palpitations, chest pain, shortness of breath  Labile hypertension  Risk  Factors:  Hypertension and age greater than 36  Cath/PCI:  None  CV Surgery:  None  EP Procedures and Devices:  None  Non-Invasive Evaluation(s):  Pharmacologic myocardial perfusion stress test (09/11/16): Low risk study with small in size, moderate in severity, mid anterior defect that is partially reversible. LVEF 58%.  Transthoracic echocardiogram (08/27/16): Normal LV size and wall thickness. LVEF 60-65% with normal wall motion. Grade 2 diastolic dysfunction. Mild to moderate aortic regurgitation present. Mild mitral annular calcification is noted as well as mitral valve thickening. There is mild to moderate mitral regurgitation. Left atrium is moderately dilated. RV size and function are normal. There is mild pulmonary hypertension. Small circumferential pericardial effusion is present.  Renal artery Doppler (06/15/16): No evidence of renal artery stenosis. Small hyperechoic focus in the inferior right kidney may represent vascular calcification or a nonobstructive stone.  Recent CV Pertinent Labs: Sodium 144, potassium 3.9, chloride 104, CO2 24, BUN 22, creatinine 1.2, glucose 105, calcium 9.7, AST 24, ALT 13, alkaline phosphatase 19, total bilirubin 0.4, total protein 6.3, albumin 4.2  TSH 0.786  White blood cell count 4.8, hemoglobin 11.0, hematocrit 33.3, platelets 198  Hemoglobin A1c 5.8  Past medical and surgical history were reviewed and updated in EPIC.   Outpatient Encounter Prescriptions as of 09/27/2016  Medication Sig  . alendronate (FOSAMAX) 70 MG tablet Take 70 mg by mouth once a week. Take with a full glass of water on an empty stomach.  Marland Kitchen aspirin EC 81 MG tablet Take 1 tablet (81 mg total) by mouth daily.  . chlorthalidone (HYGROTON) 25 MG tablet Take 1 tablet (25 mg total) by mouth daily.  Marland Kitchen doxazosin (CARDURA) 4 MG tablet Take 4 mg by mouth daily.  . fenofibrate 160 MG tablet Take 160 mg by mouth daily.  Marland Kitchen levothyroxine (SYNTHROID, LEVOTHROID) 100 MCG  tablet Take 100  mcg by mouth daily.  Marland Kitchen losartan (COZAAR) 100 MG tablet Take 100 mg by mouth daily.  . multivitamin-lutein (OCUVITE-LUTEIN) CAPS capsule Take 1 capsule by mouth daily.  . Polyvinyl Alcohol-Povidone (REFRESH OP) Apply 1 drop to eye 4 (four) times daily as needed (dry eyes).    No facility-administered encounter medications on file as of 09/27/2016.     Allergies: Codeine; Penicillins; Tricor [fenofibrate]; Verapamil; and Sulfa antibiotics  Social History   Social History  . Marital status: Divorced    Spouse name: N/A  . Number of children: N/A  . Years of education: N/A   Occupational History  . Not on file.   Social History Main Topics  . Smoking status: Former Smoker    Packs/day: 0.25    Types: Cigarettes    Quit date: 10/22/1974  . Smokeless tobacco: Never Used  . Alcohol use No  . Drug use: No  . Sexual activity: Not on file   Other Topics Concern  . Not on file   Social History Narrative  . No narrative on file    Family History  Problem Relation Age of Onset  . Rectal cancer Father   . Hepatitis Father   . Hypertension Mother   . Hyperlipidemia Mother   . Glaucoma Mother   . Stroke Mother     TIA  . Heart disease Maternal Grandfather   . Colon cancer Neg Hx     Review of Systems: A 12-system review of systems was performed and was negative except as noted in the HPI.  --------------------------------------------------------------------------------------------------  Physical Exam: BP (!) 144/64   Pulse (!) 56   Ht 5' 4.5" (1.638 m)   Wt 137 lb (62.1 kg)   LMP  (LMP Unknown)   BMI 23.15 kg/m   General:  Well-developed, well-nourished woman seated comfortably in the exam room. HEENT: No conjunctival pallor or scleral icterus.  Moist mucous membranes.  OP clear. Neck: Supple without lymphadenopathy, thyromegaly, JVD, or HJR. Lungs: Normal work of breathing.  Clear to auscultation bilaterally without wheezes or crackles. Heart:  Regular rate and rhythm with 3/6 holosystolic murmur across the precordium. No rubs or gallops.  Non-displaced PMI. Abd: Bowel sounds present.  Soft, NT/ND without hepatosplenomegaly Ext: No lower extremity edema.  Radial, PT, and DP pulses are 2+ bilaterally. Skin: warm and dry without rash  --------------------------------------------------------------------------------------------------  ASSESSMENT AND PLAN: Chest pain, shortness of breath, and fatigue This seems to have improved since her last visit, the patient per anomaly complains of a rapid heartbeat when she exerts herself rather than significant chest pain or shortness of breath. She appears euvolemic and well compensated on exam today. Recent echocardiogram was notable for grade 2 diastolic dysfunction as well as mild to moderate regurgitation of both the mitral and aortic valves. Myocardial perfusion stress test showed a small in size, moderate in severity, mid anterior partially reversible defect that may reflect some degree of ischemia and/or artifact. We have agreed to forego coronary angiography at this time in favor of medical management. Low resting heart rate precludes addition of a beta blocker. We have agreed to discontinue doxazosin and initiate amlodipine 5 mg daily. We also discussed adding a statin, the patient reports that she has been intolerant of this in the past. We will check a lipid panel today and then discuss starting low-dose rosuvastatin based on the results.  Hypertension Blood pressure is mildly elevated today. Patient seems to be tolerating chlorthalidone well. We have agreed to discontinue doxazosin and initiate  amlodipine 5 mg daily. Patient should continue to monitor her blood pressures at home.  Pericardial effusion Small effusion was incidentally noted on recent echocardiogram. Etiology remains unclear, but given her multiple symptoms including fatigue, dyspnea, and weight loss, and autoimmune etiology is a  consideration. I have personally reviewed her echo and agree that there is no evidence of tamponade physiology. The effusion would also be difficult to tap, given its size. Today, we will obtain a CBC with differential, CMP, ESR, CRP, and ANA. If this workup is unrevealing, the patient may need to go further screening for possible malignancy; I would defer this to her PCP.  Follow-up: Return to clinic on 11/23/15.  Nelva Bush, MD 09/27/2016 2:14 PM

## 2016-09-27 NOTE — Telephone Encounter (Signed)
Patient should see me as scheduled, if possible, today at 8:30.  I have a meeting at 7:30 but should be in the office by 8:30 or shortly thereafter.  If she is not able to make it at 8:30, please add her on to my schedule for the late morning or early afternoon.

## 2016-09-27 NOTE — Telephone Encounter (Signed)
I have added patient back to your schedule today at 0830.  Have a great day!

## 2016-09-28 DIAGNOSIS — L253 Unspecified contact dermatitis due to other chemical products: Secondary | ICD-10-CM | POA: Diagnosis not present

## 2016-09-28 LAB — ANA: Anti Nuclear Antibody(ANA): NEGATIVE

## 2016-09-28 LAB — C-REACTIVE PROTEIN: CRP: 0.6 mg/L (ref <8.0)

## 2016-09-28 LAB — SEDIMENTATION RATE: Sed Rate: 4 mm/hr (ref 0–30)

## 2016-10-02 DIAGNOSIS — I10 Essential (primary) hypertension: Secondary | ICD-10-CM | POA: Diagnosis not present

## 2016-10-02 DIAGNOSIS — Z6823 Body mass index (BMI) 23.0-23.9, adult: Secondary | ICD-10-CM | POA: Diagnosis not present

## 2016-10-02 DIAGNOSIS — R634 Abnormal weight loss: Secondary | ICD-10-CM | POA: Diagnosis not present

## 2016-10-02 DIAGNOSIS — D649 Anemia, unspecified: Secondary | ICD-10-CM | POA: Diagnosis not present

## 2016-10-02 DIAGNOSIS — R748 Abnormal levels of other serum enzymes: Secondary | ICD-10-CM | POA: Diagnosis not present

## 2016-10-02 DIAGNOSIS — N184 Chronic kidney disease, stage 4 (severe): Secondary | ICD-10-CM | POA: Diagnosis not present

## 2016-10-02 DIAGNOSIS — E890 Postprocedural hypothyroidism: Secondary | ICD-10-CM | POA: Diagnosis not present

## 2016-11-02 DIAGNOSIS — R748 Abnormal levels of other serum enzymes: Secondary | ICD-10-CM | POA: Diagnosis not present

## 2016-11-02 DIAGNOSIS — N184 Chronic kidney disease, stage 4 (severe): Secondary | ICD-10-CM | POA: Diagnosis not present

## 2016-11-02 DIAGNOSIS — I10 Essential (primary) hypertension: Secondary | ICD-10-CM | POA: Diagnosis not present

## 2016-11-02 DIAGNOSIS — E782 Mixed hyperlipidemia: Secondary | ICD-10-CM | POA: Diagnosis not present

## 2016-11-02 DIAGNOSIS — Z6822 Body mass index (BMI) 22.0-22.9, adult: Secondary | ICD-10-CM | POA: Diagnosis not present

## 2016-11-02 DIAGNOSIS — M79604 Pain in right leg: Secondary | ICD-10-CM | POA: Diagnosis not present

## 2016-11-02 DIAGNOSIS — E890 Postprocedural hypothyroidism: Secondary | ICD-10-CM | POA: Diagnosis not present

## 2016-11-22 ENCOUNTER — Other Ambulatory Visit: Payer: Self-pay | Admitting: *Deleted

## 2016-11-22 ENCOUNTER — Encounter (INDEPENDENT_AMBULATORY_CARE_PROVIDER_SITE_OTHER): Payer: Self-pay

## 2016-11-22 ENCOUNTER — Encounter: Payer: Self-pay | Admitting: Internal Medicine

## 2016-11-22 ENCOUNTER — Telehealth: Payer: Self-pay | Admitting: Internal Medicine

## 2016-11-22 ENCOUNTER — Ambulatory Visit (INDEPENDENT_AMBULATORY_CARE_PROVIDER_SITE_OTHER): Payer: Medicare HMO | Admitting: Internal Medicine

## 2016-11-22 VITALS — BP 152/64 | HR 58 | Ht 64.0 in | Wt 131.8 lb

## 2016-11-22 DIAGNOSIS — R634 Abnormal weight loss: Secondary | ICD-10-CM | POA: Diagnosis not present

## 2016-11-22 DIAGNOSIS — R5381 Other malaise: Secondary | ICD-10-CM

## 2016-11-22 DIAGNOSIS — I10 Essential (primary) hypertension: Secondary | ICD-10-CM

## 2016-11-22 DIAGNOSIS — I2510 Atherosclerotic heart disease of native coronary artery without angina pectoris: Secondary | ICD-10-CM

## 2016-11-22 DIAGNOSIS — R5383 Other fatigue: Secondary | ICD-10-CM

## 2016-11-22 DIAGNOSIS — I38 Endocarditis, valve unspecified: Secondary | ICD-10-CM | POA: Diagnosis not present

## 2016-11-22 NOTE — Telephone Encounter (Signed)
Medication list updated to reflect pt is taking HCTZ 12.5mg  daily

## 2016-11-22 NOTE — Patient Instructions (Addendum)
Medication Instructions:  Your physician recommends that you continue on your current medications as directed. Please refer to the Current Medication list given to you today.  Labwork: None   Testing/Procedures: None   Follow-Up: Your physician recommends that you schedule a follow-up appointment in: 3 months with Dr End.   Any Other Special Instructions Will Be Listed Below (If Applicable).  Call me and let me know if you are taking chlorthalidone. Webb Silversmith [phone]    If you need a refill on your cardiac medications before your next appointment, please call your pharmacy.

## 2016-11-22 NOTE — Progress Notes (Signed)
Follow-up Outpatient Visit Date: 11/22/2016  Chief Complaint: Follow-up hypertension, chest pain, and shortness of breath  HPI:  Debra Barry is a 66 y.o. year-old female with history of hypertension, hyperlipidemia, impaired glucose tolerance, hypothyroidism, and heart murmur, who presents for follow-up of hypertension, chest pain, shortness of breath. I first met Debra Barry on 08/17/16. After that visit, we initially proceeded with a transthoracic echocardiogram that demonstrated normal LV contraction and grade 2 diastolic dysfunction with mild to moderate aortic and mitral valve regurgitation. Mild pulmonary hypertension was also evident, as well as a small pericardial effusion. Subsequent myocardial perfusion stress test showed a small in size, moderate in severity, mid anterior defect with preserved LVEF, consistent with a low risk study. At our last visit on 09/27/16, we obtained multiple labs notable for renal insufficiency with a creatinine of 1.9, elevated CK at 1,081, and normocytic anemia with a hemoglobin of 10.2. We discontinued chlorthalidone and Tricor after reviewing the results, and had the patient follow-up with her PCP for further evaluation.  Repeat labs t [facility] showed improvement in creatine to 1.13 and normalization of CK.  The patient was started on HCTZ 12.5 mg daily with increase in amlodipine to 10 mg daily to improve blood pressure control.  Today, Debra Barry continues to complain of fatigue and generalized malaise.  She often feels nauseated, especially when first getting up in the morning.  She sleeps ok at night but does not always feel well-rested in the morning.  She notes having lost about 30 pounds over the last few months (unintentional).  She attributes some of this to being on too much thyroid medicine, though she has continued to lose weight despite adjustment in her Synthroid.  She denies abdominal pain, as well as nausea associated with eating.   She denies chest pain, palpitations, and lightheadedness.  She has stable orthopnea without PND or leg edema.  She has not had any dyspnea on exertion, but at times has felt as though she could become short of breath if she does not slow down.  He blood pressures have remained elevated but are somewhat less labile.  She aims for a SBP around 150 mmHg; she becomes nauseated with SBP < 140 mmhg.  --------------------------------------------------------------------------------------------------  Cardiovascular History & Procedures: Cardiovascular Problems:  Palpitations, chest pain, shortness of breath  Labile hypertension  Risk Factors:  Hypertension and age greater than 19  Cath/PCI:  None  CV Surgery:  None  EP Procedures and Devices:  None  Non-Invasive Evaluation(s):  Pharmacologic myocardial perfusion stress test (09/11/16): Low risk study with small in size, moderate in severity, mid anterior defect that is partially reversible. LVEF 58%.  Transthoracic echocardiogram (08/27/16): Normal LV size and wall thickness. LVEF 60-65% with normal wall motion. Grade 2 diastolic dysfunction. Mild to moderate aortic regurgitation present. Mild mitral annular calcification is noted as well as mitral valve thickening. There is mild to moderate mitral regurgitation. Left atrium is moderately dilated. RV size and function are normal. There is mild pulmonary hypertension. Small circumferential pericardial effusion is present.  Renal artery Doppler (06/15/16): No evidence of renal artery stenosis. Small hyperechoic focus in the inferior right kidney may represent vascular calcification or a nonobstructive stone.  Recent CV Pertinent Labs: 11/02/16: Na 142, K 4.6, BUN 24, creatinine 1.13, AST 16, ALT 26, TSH 1.5, total CK 81, total cholesterol 260, triglycerides 118, HDL 44, LDL 192  Past medical and surgical history were reviewed and updated in EPIC.   Outpatient Encounter  Prescriptions as of  11/22/2016  Medication Sig  . alendronate (FOSAMAX) 70 MG tablet Take 70 mg by mouth once a week. Take with a full glass of water on an empty stomach.  Marland Kitchen amLODipine (NORVASC) 10 MG tablet Take 10 mg by mouth daily.  Marland Kitchen aspirin EC 81 MG tablet Take 1 tablet (81 mg total) by mouth daily.  Marland Kitchen levothyroxine (SYNTHROID, LEVOTHROID) 50 MCG tablet Take 75 mcg by mouth daily.   Marland Kitchen losartan (COZAAR) 100 MG tablet Take 100 mg by mouth daily.  . multivitamin-lutein (OCUVITE-LUTEIN) CAPS capsule Take 1 capsule by mouth daily.  . Polyvinyl Alcohol-Povidone (REFRESH OP) Apply 1 drop to eye 4 (four) times daily as needed (dry eyes).   . [DISCONTINUED] chlorthalidone (HYGROTON) 25 MG tablet Take 1 tablet (25 mg total) by mouth daily.  . [DISCONTINUED] fenofibrate 160 MG tablet Take 160 mg by mouth daily.  . [DISCONTINUED] amLODipine (NORVASC) 5 MG tablet Take 1 tablet (5 mg total) by mouth daily. (Patient not taking: Reported on 11/22/2016)   No facility-administered encounter medications on file as of 11/22/2016.     Allergies: Codeine; Penicillins; Tricor [fenofibrate]; Verapamil; and Sulfa antibiotics  Social History   Social History  . Marital status: Divorced    Spouse name: N/A  . Number of children: N/A  . Years of education: N/A   Occupational History  . Not on file.   Social History Main Topics  . Smoking status: Former Smoker    Packs/day: 0.25    Types: Cigarettes    Quit date: 10/22/1974  . Smokeless tobacco: Never Used  . Alcohol use No  . Drug use: No  . Sexual activity: Not on file   Other Topics Concern  . Not on file   Social History Narrative  . No narrative on file    Family History  Problem Relation Age of Onset  . Rectal cancer Father   . Hepatitis Father   . Hypertension Mother   . Hyperlipidemia Mother   . Glaucoma Mother   . Stroke Mother     TIA  . Heart disease Maternal Grandfather   . Colon cancer Neg Hx     Review of Systems: A 12-system review of systems  was performed and was negative except as noted in the HPI.  --------------------------------------------------------------------------------------------------  Physical Exam: BP (!) 152/64   Pulse (!) 58   Ht 5' 4"  (1.626 m)   Wt 131 lb 12.8 oz (59.8 kg)   LMP  (LMP Unknown)   SpO2 98%   BMI 22.62 kg/m   General:  Well-developed woman seated on exam table HEENT: No conjunctival pallor or scleral icterus.  Moist mucous membranes.  OP clear. Neck: Supple without lymphadenopathy, thyromegaly, JVD, or HJR. Lungs: Normal work of breathing.  Clear to auscultation bilaterally without wheezes or crackles. Heart: Regular rate and rhythm with 2/6 systolic murmur across the precordium and 1/6 early diastolic murmur. No rubs or gallops.  Non-displaced PMI. Abd: Bowel sounds present.  Soft, NT/ND without hepatosplenomegaly Ext: No lower extremity edema.  Radial, PT, and DP pulses are 2+ bilaterally. Skin: warm and dry without rash  --------------------------------------------------------------------------------------------------  ASSESSMENT AND PLAN: Coronary artery disease No report of angina or worsening shortness of breath. Myocardial perfusion stress test in 08/2016 showed small area of anterior ischemia with normal LVEF consistent with a low-risk study.  We will continue with our current medication regimen.  The patient is not on a statin at this time due to intolerance to multiple  agents in the past.  LDL in our office in 09/2016 was reasonable at 80, though recent labs at her PCP's office is notable for significantly elevated LDL (192).  I am not sure why there is such a large discrepancy.  We discussed retrial of statin therapy but have agreed to defer pending workup of her weight loss, fatigue, and nausea.  Valvular heart disease TTE in 08/2016 showed mild to moderate AI and mild to moderate MR with moderate LA dilation and mildly elevated PA pressure.  The patient appears euvolemic on exam  today. She would benefit from more aggressive blood pressure control but is concerned about symptoms (predominantly nausea) when her systolic BP is less than 517 mmHg.  We will continue to follow her clinically and consider left and right heart catheterization to better assess her hemodynamics if she has progressive shortness of breath.  Fatigue, malaise, and weight loss I am still unsure as to the cause of this.  However, I remain concerned that underlying malignancy or autoimmune process may be contributing, given that her labs in 09/2016 showed normocytic anemia, renal insufficiency, and elevated CK.  Of note sed rate and ANA were negative at the time.  I have advised the patient to readdress these issues with her PCP as soon as possible.  Hypertension Blood pressure mildly elevated today.  We will not make any medication changes today, though uptitration of HCTZ can be considered.  I will defer this to the patient's upcoming PCP visit.  Follow-up: Return to clinic in 3 months.  Nelva Bush, MD 11/22/2016 4:14 AM

## 2016-11-22 NOTE — Telephone Encounter (Signed)
new message    pt verbalized that she is returning call for rn   Chlorthalidone pt said she is not taking this medication    Hydrochlorothiazide 12.5mg  pt said she is taking this medication   Also she has appt on 12-03-2016 @ 11:15am appt with Dr.Conroy

## 2016-11-23 ENCOUNTER — Encounter: Payer: Self-pay | Admitting: Internal Medicine

## 2016-12-03 DIAGNOSIS — Z1231 Encounter for screening mammogram for malignant neoplasm of breast: Secondary | ICD-10-CM | POA: Diagnosis not present

## 2016-12-03 DIAGNOSIS — E890 Postprocedural hypothyroidism: Secondary | ICD-10-CM | POA: Diagnosis not present

## 2016-12-03 DIAGNOSIS — R634 Abnormal weight loss: Secondary | ICD-10-CM | POA: Diagnosis not present

## 2016-12-03 DIAGNOSIS — I10 Essential (primary) hypertension: Secondary | ICD-10-CM | POA: Diagnosis not present

## 2016-12-03 DIAGNOSIS — Z6821 Body mass index (BMI) 21.0-21.9, adult: Secondary | ICD-10-CM | POA: Diagnosis not present

## 2016-12-03 DIAGNOSIS — N183 Chronic kidney disease, stage 3 (moderate): Secondary | ICD-10-CM | POA: Diagnosis not present

## 2016-12-12 DIAGNOSIS — Z1231 Encounter for screening mammogram for malignant neoplasm of breast: Secondary | ICD-10-CM | POA: Diagnosis not present

## 2017-02-05 ENCOUNTER — Encounter: Payer: Self-pay | Admitting: Internal Medicine

## 2017-02-25 ENCOUNTER — Ambulatory Visit (INDEPENDENT_AMBULATORY_CARE_PROVIDER_SITE_OTHER): Payer: Medicare HMO | Admitting: Internal Medicine

## 2017-02-25 ENCOUNTER — Telehealth: Payer: Self-pay | Admitting: *Deleted

## 2017-02-25 ENCOUNTER — Encounter: Payer: Self-pay | Admitting: Internal Medicine

## 2017-02-25 VITALS — BP 132/70 | HR 54 | Ht 64.0 in | Wt 137.0 lb

## 2017-02-25 DIAGNOSIS — I25118 Atherosclerotic heart disease of native coronary artery with other forms of angina pectoris: Secondary | ICD-10-CM | POA: Insufficient documentation

## 2017-02-25 DIAGNOSIS — I2510 Atherosclerotic heart disease of native coronary artery without angina pectoris: Secondary | ICD-10-CM

## 2017-02-25 DIAGNOSIS — R5383 Other fatigue: Secondary | ICD-10-CM

## 2017-02-25 DIAGNOSIS — I38 Endocarditis, valve unspecified: Secondary | ICD-10-CM | POA: Diagnosis not present

## 2017-02-25 DIAGNOSIS — I10 Essential (primary) hypertension: Secondary | ICD-10-CM | POA: Diagnosis not present

## 2017-02-25 NOTE — Patient Instructions (Signed)
Medication Instructions:  Your physician recommends that you continue on your current medications as directed. Please refer to the Current Medication list given to you today.   Labwork: None   Testing/Procedures: Your physician has recommended that you have a sleep study. This test records several body functions during sleep, including: brain activity, eye movement, oxygen and carbon dioxide blood levels, heart rate and rhythm, breathing rate and rhythm, the flow of air through your mouth and nose, snoring, body muscle movements, and chest and belly movement.    Follow-Up: Your physician recommends that you schedule a follow-up appointment in: 3-4 months with Dr End.        If you need a refill on your cardiac medications before your next appointment, please call your pharmacy.

## 2017-02-25 NOTE — Telephone Encounter (Addendum)
Notified patient of sleep study scheduled for April 29 2017. Patient understands she will receive a letter in the mail in a week or so. Patient understands to call if she does not receive her letter in a timely manner. Patient agrees with treatment and thanked me for calling. ESS=4

## 2017-02-25 NOTE — Progress Notes (Signed)
Follow-up Outpatient Visit Date: 02/25/2017  Chief Complaint: Follow-up hypertension, chest pain, and shortness of breath  HPI:  Debra Barry is a 66 y.o. year-old female with history of hypertension, hyperlipidemia, impaired glucose tolerance, hypothyroidism, and heart murmur, who presents for follow-up of hypertension, chest pain, shortness of breath. I first met Debra Barry on 08/17/16. After that visit, we initially proceeded with a transthoracic echocardiogram that demonstrated normal LV contraction and grade 2 diastolic dysfunction with mild to moderate aortic and mitral valve regurgitation. Mild pulmonary hypertension was also evident, as well as a small pericardial effusion. Subsequent myocardial perfusion stress test showed a small in size, moderate in severity, mid anterior defect with preserved LVEF, consistent with a low risk study. At our last visit on 09/27/16, we obtained multiple labs notable for renal insufficiency with a creatinine of 1.9, elevated CK at 1,081, and normocytic anemia with a hemoglobin of 10.2. We discontinued chlorthalidone and Tricor after reviewing the results, and had the patient follow-up with her PCP for further evaluation.  Repeat labs t [facility] showed improvement in creatine to 1.13 and normalization of CK.  The patient was started on HCTZ 12.5 mg daily with increase in amlodipine to 10 mg daily to improve blood pressure control. Repeat labs from February showed stable renal function.  Today, Debra Barry reports that she is feeling somewhat better than at her prior visit. Stress in her life has improved significantly. She is more active and has also started gaining weight back. Her appetite is not great, but she has been able to eat more than in the past several months. Her blood pressure has been up and down somewhat but is typically between 120 and 140/60-80. She continues to have some shortness of breath when she lies flat on her back, though  she is able to lie flat on either side were her stomach without orthopnea or PND. She has been told in the past that she snores. She also continues to have some daytime fatigue. Despite this, she has been walking 2 miles every other day. She denies edema, palpitations, and lightheadedness. She also has not had any chest pain.  --------------------------------------------------------------------------------------------------  Cardiovascular History & Procedures: Cardiovascular Problems:  Palpitations, chest pain, shortness of breath  Labile hypertension  Risk Factors:  Hypertension and age greater than 29  Cath/PCI:  None  CV Surgery:  None  EP Procedures and Devices:  None  Non-Invasive Evaluation(s):  Pharmacologic myocardial perfusion stress test (09/11/16): Low risk study with small in size, moderate in severity, mid anterior defect that is partially reversible. LVEF 58%.  Transthoracic echocardiogram (08/27/16): Normal LV size and wall thickness. LVEF 60-65% with normal wall motion. Grade 2 diastolic dysfunction. Mild to moderate aortic regurgitation present. Mild mitral annular calcification is noted as well as mitral valve thickening. There is mild to moderate mitral regurgitation. Left atrium is moderately dilated. RV size and function are normal. There is mild pulmonary hypertension. Small circumferential pericardial effusion is present.  Renal artery Doppler (06/15/16): No evidence of renal artery stenosis. Small hyperechoic focus in the inferior right kidney may represent vascular calcification or a nonobstructive stone.  Recent CV Pertinent Labs: 11/02/16: Na 142, K 4.6, BUN 24, creatinine 1.13, AST 16, ALT 26, TSH 1.5, total CK 81, total cholesterol 260, triglycerides 118, HDL 44, LDL 192  Past medical and surgical history were reviewed and updated in EPIC.   Outpatient Encounter Prescriptions as of 02/25/2017  Medication Sig  . alendronate (FOSAMAX) 70 MG tablet  Take 70 mg by mouth once a week. Take with a full glass of water on an empty stomach.  Marland Kitchen amLODipine (NORVASC) 10 MG tablet Take 10 mg by mouth daily.  . cyclobenzaprine (FLEXERIL) 10 MG tablet Take 10 mg by mouth daily as needed.  . hydrochlorothiazide (MICROZIDE) 12.5 MG capsule 1 capsule (12.5 mg total) daily. Pt reports currently taking this medication  . levothyroxine (SYNTHROID, LEVOTHROID) 50 MCG tablet Take 75 mcg by mouth daily.   Marland Kitchen losartan (COZAAR) 100 MG tablet Take 100 mg by mouth daily.  . multivitamin-lutein (OCUVITE-LUTEIN) CAPS capsule Take 1 capsule by mouth daily.  . Omega-3 Fatty Acids (FISH OIL) 1000 MG CPDR Take 1 capsule by mouth 2 (two) times daily.  . Polyvinyl Alcohol-Povidone (REFRESH OP) Apply 1 drop to eye 4 (four) times daily as needed (dry eyes).   . Tetrahydrozoline HCl (EYE DROPS OP) Place 1 drop into both eyes at bedtime as needed. Refresh eye gel  . [DISCONTINUED] aspirin EC 81 MG tablet Take 1 tablet (81 mg total) by mouth daily.   No facility-administered encounter medications on file as of 02/25/2017.     Allergies: Codeine; Penicillins; Tricor [fenofibrate]; Verapamil; and Sulfa antibiotics  Social History   Social History  . Marital status: Divorced    Spouse name: N/A  . Number of children: N/A  . Years of education: N/A   Occupational History  . Not on file.   Social History Main Topics  . Smoking status: Former Smoker    Packs/day: 0.25    Types: Cigarettes    Quit date: 10/22/1974  . Smokeless tobacco: Never Used  . Alcohol use No  . Drug use: No  . Sexual activity: Not on file   Other Topics Concern  . Not on file   Social History Narrative  . No narrative on file    Family History  Problem Relation Age of Onset  . Rectal cancer Father   . Hepatitis Father   . Hypertension Mother   . Hyperlipidemia Mother   . Glaucoma Mother   . Stroke Mother     TIA  . Heart disease Maternal Grandfather   . Colon cancer Neg Hx      Review of Systems: A 12-system review of systems was performed and was negative except as noted in the HPI.  --------------------------------------------------------------------------------------------------  Physical Exam: BP 132/70   Pulse (!) 54   Ht _0  (1.626 m)   Wt 137 lb (62.1 kg)   LMP  (LMP Unknown)   SpO2 99%   BMI 23.52 kg/m   General:  Well-developed woman seated on exam table HEENT: No conjunctival pallor or scleral icterus.  Moist mucous membranes.  OP clear. Neck: Supple without lymphadenopathy, thyromegaly, JVD, or HJR. Lungs: Normal work of breathing.  Clear to auscultation bilaterally without wheezes or crackles. Heart: Regular rate and rhythm with 2/6 systolic murmur across the precordium No rubs or gallops.  Non-displaced PMI. Abd: Bowel sounds present.  Soft, NT/ND without hepatosplenomegaly Ext: No lower extremity edema.  Radial, PT, and DP pulses are 2+ bilaterally. Skin: warm and dry without rash  Outside labs (12/03/16): CMP: Sodium 146, potassium 4.3, chloride 104, CO2 20, BUN 31, creatinine 1.2, calcium 9.5, AST 22, ALT 10, alkaline phosphatase 48, total bilirubin 0.5, total protein 6.9, albumin 4.4  CBC: WBC 4.7, HGB 12.9, HCT 38.1, platelets 228  TSH: 0.476  --------------------------------------------------------------------------------------------------  ASSESSMENT AND PLAN: Coronary artery disease Debra Barry has not had any further chest pain.  Shortness of breath is also minimal and present only with prolonged walking (greater than 2 miles). Stress test last year was low risk. Statin therapy is currently being held due to elevated CPK in the setting of statin and fibrin use. Follow-up CK at her PCP in January was normal. We will defer medication changes at this time.  Valvular heart disease TTE in 08/2016 showed mild to moderate AI and mild to moderate MR with moderate LA dilation and mildly elevated PA pressure.  Debra Barry appears  euvolemic today. She has NYHA class II symptoms and actually feels better compared to our prior visit. We will not make any medication changes at this time, given that her blood pressure is now better controlled. We will plan to repeat an echocardiogram this fall to reassess her valvular heart disease.  Fatigue This has improved since our last visit. Patient attributes many of her symptoms to stress, which is now better. I am concerned that she may have an element of sleep apnea, given some daytime somnolence, snoring, and labile blood pressures. We will refer Debra Barry for a sleep study.  Hypertension Blood pressure is upper normal today. Home readings have been in the normal to borderline elevated range over the last few months. She is tolerating her current medication regimen well. We will not make any changes at this time.  Follow-up: Return to clinic in 3 months.  Nelva Bush, MD 02/25/2017 3:20 PM

## 2017-04-08 DIAGNOSIS — H43813 Vitreous degeneration, bilateral: Secondary | ICD-10-CM | POA: Diagnosis not present

## 2017-04-08 DIAGNOSIS — H2513 Age-related nuclear cataract, bilateral: Secondary | ICD-10-CM | POA: Diagnosis not present

## 2017-04-29 ENCOUNTER — Ambulatory Visit (HOSPITAL_BASED_OUTPATIENT_CLINIC_OR_DEPARTMENT_OTHER): Payer: Medicare HMO | Attending: Internal Medicine | Admitting: Cardiology

## 2017-04-29 VITALS — Ht 64.0 in | Wt 136.0 lb

## 2017-04-29 DIAGNOSIS — Z7982 Long term (current) use of aspirin: Secondary | ICD-10-CM | POA: Insufficient documentation

## 2017-04-29 DIAGNOSIS — R0683 Snoring: Secondary | ICD-10-CM

## 2017-04-29 DIAGNOSIS — Z79899 Other long term (current) drug therapy: Secondary | ICD-10-CM | POA: Insufficient documentation

## 2017-04-29 DIAGNOSIS — I491 Atrial premature depolarization: Secondary | ICD-10-CM | POA: Insufficient documentation

## 2017-04-29 DIAGNOSIS — G478 Other sleep disorders: Secondary | ICD-10-CM

## 2017-04-29 DIAGNOSIS — I493 Ventricular premature depolarization: Secondary | ICD-10-CM | POA: Insufficient documentation

## 2017-04-29 DIAGNOSIS — I10 Essential (primary) hypertension: Secondary | ICD-10-CM | POA: Diagnosis not present

## 2017-04-29 DIAGNOSIS — R5383 Other fatigue: Secondary | ICD-10-CM | POA: Insufficient documentation

## 2017-04-29 DIAGNOSIS — G4719 Other hypersomnia: Secondary | ICD-10-CM | POA: Insufficient documentation

## 2017-04-30 ENCOUNTER — Telehealth: Payer: Self-pay | Admitting: *Deleted

## 2017-04-30 NOTE — Procedures (Signed)
   Patient Name: Debra Barry, Debra Barry Date: 04/29/2017 Gender: Female D.O.B: 1951/04/19 Age (years): 65 Referring Provider: Nelva Bush MD Height (inches): 64 Interpreting Physician: Fransico Him MD, ABSM Weight (lbs): 136 RPSGT: Carolin Coy BMI: 23 MRN: [id number] Neck Size: 12.50  CLINICAL INFORMATION Sleep Study Type: NPSG Indication for sleep study: Excessive Daytime Sleepiness, Fatigue, Hypertension, Snoring Epworth Sleepiness Score: 3  SLEEP STUDY TECHNIQUE As per the AASM Manual for the Scoring of Sleep and Associated Events v2.3 (April 2016) with a hypopnea requiring 4% desaturations.  The channels recorded and monitored were frontal, central and occipital EEG, electrooculogram (EOG), submentalis EMG (chin), nasal and oral airflow, thoracic and abdominal wall motion, anterior tibialis EMG, snore microphone, electrocardiogram, and pulse oximetry.  MEDICATIONS Medications self-administered by patient taken the night of the study : aspirin, AMLODIPINE, DAILY MULTIVITAMIN  SLEEP ARCHITECTURE The study was initiated at 10:33:56 PM and ended at 5:03:57 AM.  Sleep onset time was 88.9 minutes and the sleep efficiency was 51.8%. The total sleep time was 202.0 minutes.  Stage REM latency was N/A minutes.  The patient spent 21.78% of the night in stage N1 sleep, 78.22% in stage N2 sleep, 0.00% in stage N3 and 0.00% in REM.  Alpha intrusion was absent.  Supine sleep was 37.87%.  RESPIRATORY PARAMETERS The overall apnea/hypopnea index (AHI) was 0.6 per hour. There were 1 total apneas, including 1 obstructive, 0 central and 0 mixed apneas. There were 1 hypopneas and 71 RERAs.  The AHI during Stage REM sleep was N/A per hour.  AHI while supine was 1.6 per hour.  The mean oxygen saturation was 93.75%. The minimum SpO2 during sleep was 91.00%.  Soft snoring was noted during this study.  CARDIAC DATA The 2 lead EKG demonstrated sinus rhythm. The mean heart rate  was 45.51 beats per minute. Other EKG findings include: PVCs and PACs.  LEG MOVEMENT DATA The total PLMS were 0 with a resulting PLMS index of 0.00. Associated arousal with leg movement index was 0.0 .  IMPRESSIONS - No significant obstructive sleep apnea occurred during this study (AHI = 0.6/h).  Patient did have evidence of upper airway resistance syndrome with an RDI od 20/hr.   - No significant central sleep apnea occurred during this study (CAI = 0.0/h). - Mild oxygen desaturation was noted during this study (Min O2 = 91.00%). - The patient snored with Soft snoring volume. - EKG findings include PVCs and PACs. - Clinically significant periodic limb movements did not occur during sleep. No significant associated arousals.  DIAGNOSIS - Upper Airwary resistance syndrome  RECOMMENDATIONS - Avoid alcohol, sedatives and other CNS depressants that may worsen sleep apnea and disrupt normal sleep architecture. - Sleep hygiene should be reviewed to assess factors that may improve sleep quality. - Weight management and regular exercise should be initiated or continued if appropriate. - Avoid sleeping in the supine position as patient had evidence of upper airway resistance syndrome that was noted mainly in the supine position.   [place], American Board of Sleep Medicine  ELECTRONICALLY SIGNED ON:  04/30/2017, 1:34 PM [place] PH: [phone]   FX: [phone] [place]

## 2017-04-30 NOTE — Telephone Encounter (Signed)
-----   Message from Sueanne Margarita, MD sent at 04/30/2017  1:37 PM EDT ----- Please let patient know that she does not have OSA but does have some mild upper airway resistance when sleeping on her back.  Please encourage her to avoid sleeping supine.  No CPAP indicated at this time.

## 2017-04-30 NOTE — Telephone Encounter (Signed)
Informed patient of sleep study results and patient understanding was verbalized. Patient understands she does not have sleep apnea but does have some mild upper airway resistance when sleeping on her back. Patient has been encouraged to avoid sleeping on her back. Patient understands that NO CPAP is indicated at this time. Patient was grateful for the call.

## 2017-05-17 DIAGNOSIS — Z6823 Body mass index (BMI) 23.0-23.9, adult: Secondary | ICD-10-CM | POA: Diagnosis not present

## 2017-05-17 DIAGNOSIS — M5432 Sciatica, left side: Secondary | ICD-10-CM | POA: Diagnosis not present

## 2017-06-02 NOTE — Progress Notes (Signed)
Follow-up Outpatient Visit Date: 06/03/2017  Chief Complaint: Follow-up shortness of breath and hypertension  HPI:  Debra Barry is a 66 y.o. year-old female with history of mild to moderate aortic and mitral regurgitation,hypertension, hyperlipidemia, impaired glucose tolerance, hypothyroidism, and heart murmur, who presents for follow-up. I last saw her in May, at which time she was starting to feel better. Today, she reports that she has been doing quite well. She has not had any chest pain or edema. She notes shortness of breath only when she lies completely flat on her back. She is able to lay on her side without elevating her head without any symptoms. She continues to walk regularly, going about 6 miles a day without any limitations. She has occasional palpitations when she things that her blood pressure is elevated, though these are infrequent. She denies lightheadedness and dizziness. Her home blood pressure today was 147/69, typically ranging 120-150/60-80 at home. She is tolerating her current medication regimen well. She is scheduled for routine labs and PCP follow-up tomorrow. Debra Barry reports that she has put on some weight due to a better appetite. She attributes her overall well-being to having less stress related to her sister. Recent sleep study was negative for sleep apnea.  --------------------------------------------------------------------------------------------------  Cardiovascular History & Procedures: Cardiovascular Problems:  Palpitations, chest pain, shortness of breath  Labile hypertension  Risk Factors:  Hypertension and age greater than 71  Cath/PCI:  None  CV Surgery:  None  EP Procedures and Devices:  None  Non-Invasive Evaluation(s):  Pharmacologic myocardial perfusion stress test (09/11/16): Low risk study with small in size, moderate in severity, mid anterior defect that is partially reversible. LVEF 58%.  Transthoracic echocardiogram  (08/27/16): Normal LV size and wall thickness. LVEF 60-65% with normal wall motion. Grade 2 diastolic dysfunction. Mild to moderate aortic regurgitation present. Mild mitral annular calcification is noted as well as mitral valve thickening. There is mild to moderate mitral regurgitation. Left atrium is moderately dilated. RV size and function are normal. There is mild pulmonary hypertension. Small circumferential pericardial effusion is present.  Renal artery Doppler (06/15/16): No evidence of renal artery stenosis. Small hyperechoic focus in the inferior right kidney may represent vascular calcification or a nonobstructive stone.  Recent CV Pertinent Labs: 11/02/16: Na 142, K 4.6, BUN 24, creatinine 1.13, AST 16, ALT 26, TSH 1.5, total CK 81, total cholesterol 260, triglycerides 118, HDL 44, LDL 192  Past medical and surgical history were reviewed and updated in EPIC.   Outpatient Encounter Prescriptions as of 06/03/2017  Medication Sig  . alendronate (FOSAMAX) 70 MG tablet Take 70 mg by mouth once a week. Take with a full glass of water on an empty stomach.  Marland Kitchen amLODipine (NORVASC) 10 MG tablet Take 10 mg by mouth daily.  Marland Kitchen aspirin EC 81 MG tablet Take 81 mg by mouth daily.  . cyclobenzaprine (FLEXERIL) 10 MG tablet Take 10 mg by mouth daily as needed.  . hydrochlorothiazide (MICROZIDE) 12.5 MG capsule 1 capsule (12.5 mg total) daily. Pt reports currently taking this medication  . levothyroxine (SYNTHROID, LEVOTHROID) 50 MCG tablet Take 50 mcg by mouth daily.   Marland Kitchen losartan (COZAAR) 100 MG tablet Take 100 mg by mouth daily.  . multivitamin-lutein (OCUVITE-LUTEIN) CAPS capsule Take 1 capsule by mouth daily.  . Omega-3 Fatty Acids (FISH OIL) 1000 MG CPDR Take 1 capsule by mouth 2 (two) times daily.  . Polyvinyl Alcohol-Povidone (REFRESH OP) Apply 1 drop to eye 4 (four) times daily as needed (dry  eyes).   . Tetrahydrozoline HCl (EYE DROPS OP) Place 1 drop into both eyes at bedtime as needed. Refresh  eye gel   No facility-administered encounter medications on file as of 06/03/2017.     Allergies: Codeine; Penicillins; Tricor [fenofibrate]; Verapamil; and Sulfa antibiotics  Social History   Social History  . Marital status: Divorced    Spouse name: N/A  . Number of children: N/A  . Years of education: N/A   Occupational History  . Not on file.   Social History Main Topics  . Smoking status: Former Smoker    Packs/day: 0.25    Types: Cigarettes    Quit date: 10/22/1974  . Smokeless tobacco: Never Used  . Alcohol use No  . Drug use: No  . Sexual activity: Not on file   Other Topics Concern  . Not on file   Social History Narrative  . No narrative on file    Family History  Problem Relation Age of Onset  . Rectal cancer Father   . Hepatitis Father   . Hypertension Mother   . Hyperlipidemia Mother   . Glaucoma Mother   . Stroke Mother        TIA  . Heart disease Maternal Grandfather   . Colon cancer Neg Hx     Review of Systems: A 12-system review of systems was performed and was negative except as noted in the HPI.  --------------------------------------------------------------------------------------------------  Physical Exam: BP 120/60   Pulse (!) 52   Ht 5\' 4"  (1.626 m)   Wt 142 lb (64.4 kg)   LMP  (LMP Unknown)   BMI 24.37 kg/m   General:  Well-developed, well-nourished woman, seated comfortably in the exam room. HEENT: No conjunctival pallor or scleral icterus.  Moist mucous membranes.  OP clear. Neck: Supple without lymphadenopathy, thyromegaly, JVD, or HJR.  No carotid bruit. Lungs: Normal work of breathing.  Clear to auscultation bilaterally without wheezes or crackles. Heart: Regular rate and rhythm with 3/6 systolic murmur loudest at the left lower sternal border. No rubs or gallops.  Non-displaced PMI. Abd: Bowel sounds present.  Soft, NT/ND without hepatosplenomegaly Ext: No lower extremity edema.  Radial, PT, and DP pulses are 2+  bilaterally. Skin: Warm and dry without rash.  EKG: Sinus bradycardia (HR 52 bpm) with poor R-wave progression (unchanged). Otherwise, no significant abnormalities.  Outside labs (12/03/16): CMP: Sodium 146, potassium 4.3, chloride 104, CO2 20, BUN 31, creatinine 1.2, calcium 9.5, AST 22, ALT 10, alkaline phosphatase 48, total bilirubin 0.5, total protein 6.9, albumin 4.4  CBC: WBC 4.7, HGB 12.9, HCT 38.1, platelets 228  TSH: 0.476  --------------------------------------------------------------------------------------------------  ASSESSMENT AND PLAN: Aortic and mitral regurgitation Debra Barry appears euvolemic and well compensated with NYHA class I symptoms. We will continue her current medications, as her blood pressure is well controlled today. I will repeat an echocardiogram shortly before she returns to see me in 6 months.  Pulmonary hypertension Echo last November showed mild to moderate pulmonary hypertension. Recent sleep study was negative for obstructive sleep apnea. Repeat echo planned early next year. Debra Barry appears euvolemic at this time.  Coronary artery disease without angina No further symptoms. Debra Barry is tolerating her current medication regimen well. We will continue with indefinite low-dose aspirin, given abnormal but low risk stress test, and consider adding a statin based on upcoming lipid profile. She has had myalgias in the past with some statins in would favor trying pravastatin if lipid therapy is necessary, as she has a  friend who has tolerated this medication well in the past. We will request copy of her labs to be drawn tomorrow by her PCP.  Hypertension Blood pressure is well controlled today. No medication adjustments at this time.  Follow-up: Return to clinic in 6 months.  Nelva Bush, MD 06/03/2017 12:05 PM

## 2017-06-03 ENCOUNTER — Ambulatory Visit (INDEPENDENT_AMBULATORY_CARE_PROVIDER_SITE_OTHER): Payer: Medicare HMO | Admitting: Internal Medicine

## 2017-06-03 ENCOUNTER — Encounter: Payer: Self-pay | Admitting: Internal Medicine

## 2017-06-03 VITALS — BP 120/60 | HR 52 | Ht 64.0 in | Wt 142.0 lb

## 2017-06-03 DIAGNOSIS — I2510 Atherosclerotic heart disease of native coronary artery without angina pectoris: Secondary | ICD-10-CM | POA: Diagnosis not present

## 2017-06-03 DIAGNOSIS — I10 Essential (primary) hypertension: Secondary | ICD-10-CM | POA: Diagnosis not present

## 2017-06-03 DIAGNOSIS — I340 Nonrheumatic mitral (valve) insufficiency: Secondary | ICD-10-CM | POA: Diagnosis not present

## 2017-06-03 DIAGNOSIS — I351 Nonrheumatic aortic (valve) insufficiency: Secondary | ICD-10-CM | POA: Diagnosis not present

## 2017-06-03 DIAGNOSIS — I272 Pulmonary hypertension, unspecified: Secondary | ICD-10-CM | POA: Diagnosis not present

## 2017-06-03 NOTE — Patient Instructions (Signed)
Medication Instructions:  Your physician recommends that you continue on your current medications as directed. Please refer to the Current Medication list given to you today.   Labwork: None   Testing/Procedures: Your physician has requested that you have an echocardiogram. Echocardiography is a painless test that uses sound waves to create images of your heart. It provides your doctor with information about the size and shape of your heart and how well your heart's chambers and valves are working. This procedure takes approximately one hour. There are no restrictions for this procedure. --In 6 MONTHS--a few days before your appointment with Dr End in 6 months   Follow-Up:  Your physician wants you to follow-up in: 6 months with Dr Saunders Revel, (February 2019). You will receive a reminder letter in the mail two months in advance. If you don't receive a letter, please call our office to schedule the follow-up appointment.   Any Other Special Instructions Will Be Listed Below (If Applicable).  PLEASE ASK YOUR PRIMARY CARE DOCTOR TO SEND A COPY OF YOUR LAB RESULTS TO DR END--our fax [phone]   If you need a refill on your cardiac medications before your next appointment, please call your pharmacy.

## 2017-06-04 DIAGNOSIS — I2510 Atherosclerotic heart disease of native coronary artery without angina pectoris: Secondary | ICD-10-CM | POA: Diagnosis not present

## 2017-06-04 DIAGNOSIS — M810 Age-related osteoporosis without current pathological fracture: Secondary | ICD-10-CM | POA: Diagnosis not present

## 2017-06-04 DIAGNOSIS — Z9181 History of falling: Secondary | ICD-10-CM | POA: Diagnosis not present

## 2017-06-04 DIAGNOSIS — Z0000 Encounter for general adult medical examination without abnormal findings: Secondary | ICD-10-CM | POA: Diagnosis not present

## 2017-06-04 DIAGNOSIS — E782 Mixed hyperlipidemia: Secondary | ICD-10-CM | POA: Diagnosis not present

## 2017-06-04 DIAGNOSIS — I10 Essential (primary) hypertension: Secondary | ICD-10-CM | POA: Diagnosis not present

## 2017-06-04 DIAGNOSIS — R7303 Prediabetes: Secondary | ICD-10-CM | POA: Diagnosis not present

## 2017-06-04 DIAGNOSIS — M79604 Pain in right leg: Secondary | ICD-10-CM | POA: Diagnosis not present

## 2017-06-04 DIAGNOSIS — E890 Postprocedural hypothyroidism: Secondary | ICD-10-CM | POA: Diagnosis not present

## 2017-06-17 DIAGNOSIS — R591 Generalized enlarged lymph nodes: Secondary | ICD-10-CM | POA: Diagnosis not present

## 2017-06-17 DIAGNOSIS — Z6823 Body mass index (BMI) 23.0-23.9, adult: Secondary | ICD-10-CM | POA: Diagnosis not present

## 2017-07-23 DIAGNOSIS — Z23 Encounter for immunization: Secondary | ICD-10-CM | POA: Diagnosis not present

## 2017-10-03 DIAGNOSIS — L111 Transient acantholytic dermatosis [Grover]: Secondary | ICD-10-CM | POA: Diagnosis not present

## 2017-12-04 ENCOUNTER — Ambulatory Visit (HOSPITAL_COMMUNITY): Payer: Medicare HMO | Attending: Cardiovascular Disease

## 2017-12-04 ENCOUNTER — Other Ambulatory Visit: Payer: Self-pay

## 2017-12-04 ENCOUNTER — Encounter (INDEPENDENT_AMBULATORY_CARE_PROVIDER_SITE_OTHER): Payer: Self-pay

## 2017-12-04 DIAGNOSIS — I340 Nonrheumatic mitral (valve) insufficiency: Secondary | ICD-10-CM | POA: Diagnosis not present

## 2017-12-04 DIAGNOSIS — I371 Nonrheumatic pulmonary valve insufficiency: Secondary | ICD-10-CM | POA: Insufficient documentation

## 2017-12-04 DIAGNOSIS — I351 Nonrheumatic aortic (valve) insufficiency: Secondary | ICD-10-CM | POA: Diagnosis not present

## 2017-12-04 DIAGNOSIS — I083 Combined rheumatic disorders of mitral, aortic and tricuspid valves: Secondary | ICD-10-CM | POA: Insufficient documentation

## 2017-12-04 DIAGNOSIS — K7689 Other specified diseases of liver: Secondary | ICD-10-CM | POA: Diagnosis not present

## 2017-12-05 ENCOUNTER — Telehealth: Payer: Self-pay | Admitting: Internal Medicine

## 2017-12-05 DIAGNOSIS — I2510 Atherosclerotic heart disease of native coronary artery without angina pectoris: Secondary | ICD-10-CM | POA: Diagnosis not present

## 2017-12-05 DIAGNOSIS — E782 Mixed hyperlipidemia: Secondary | ICD-10-CM | POA: Diagnosis not present

## 2017-12-05 DIAGNOSIS — M810 Age-related osteoporosis without current pathological fracture: Secondary | ICD-10-CM | POA: Diagnosis not present

## 2017-12-05 DIAGNOSIS — I10 Essential (primary) hypertension: Secondary | ICD-10-CM | POA: Diagnosis not present

## 2017-12-05 DIAGNOSIS — E890 Postprocedural hypothyroidism: Secondary | ICD-10-CM | POA: Diagnosis not present

## 2017-12-05 DIAGNOSIS — Z139 Encounter for screening, unspecified: Secondary | ICD-10-CM | POA: Diagnosis not present

## 2017-12-05 DIAGNOSIS — Z1331 Encounter for screening for depression: Secondary | ICD-10-CM | POA: Diagnosis not present

## 2017-12-05 DIAGNOSIS — N183 Chronic kidney disease, stage 3 (moderate): Secondary | ICD-10-CM | POA: Diagnosis not present

## 2017-12-05 DIAGNOSIS — R7303 Prediabetes: Secondary | ICD-10-CM | POA: Diagnosis not present

## 2017-12-05 NOTE — Telephone Encounter (Signed)
New message ° ° ° °Patient calling for echo results. Please call °

## 2017-12-05 NOTE — Telephone Encounter (Signed)
Spoke with patient and gave her the Echo results along with Dr Darnelle Bos recommendation to f/u on March 1 @ 9:20am as planned

## 2017-12-16 DIAGNOSIS — Z1231 Encounter for screening mammogram for malignant neoplasm of breast: Secondary | ICD-10-CM | POA: Diagnosis not present

## 2017-12-16 DIAGNOSIS — M8589 Other specified disorders of bone density and structure, multiple sites: Secondary | ICD-10-CM | POA: Diagnosis not present

## 2017-12-20 ENCOUNTER — Ambulatory Visit (INDEPENDENT_AMBULATORY_CARE_PROVIDER_SITE_OTHER): Payer: Medicare HMO | Admitting: Internal Medicine

## 2017-12-20 ENCOUNTER — Encounter: Payer: Self-pay | Admitting: Internal Medicine

## 2017-12-20 VITALS — BP 160/80 | HR 52 | Ht 64.0 in | Wt 148.0 lb

## 2017-12-20 DIAGNOSIS — R001 Bradycardia, unspecified: Secondary | ICD-10-CM

## 2017-12-20 DIAGNOSIS — I2510 Atherosclerotic heart disease of native coronary artery without angina pectoris: Secondary | ICD-10-CM

## 2017-12-20 DIAGNOSIS — I080 Rheumatic disorders of both mitral and aortic valves: Secondary | ICD-10-CM | POA: Diagnosis not present

## 2017-12-20 DIAGNOSIS — E782 Mixed hyperlipidemia: Secondary | ICD-10-CM

## 2017-12-20 DIAGNOSIS — I10 Essential (primary) hypertension: Secondary | ICD-10-CM

## 2017-12-20 MED ORDER — HYDROCHLOROTHIAZIDE 25 MG PO TABS: 25.0000 mg | ORAL_TABLET | Freq: Every day | ORAL | 3 refills | Status: DC

## 2017-12-20 NOTE — Patient Instructions (Addendum)
Medication Instructions:  INCREASE Hydrochlorothiazide to 25 mg daily by mouth  -- If you need a refill on your cardiac medications before your next appointment, please call your pharmacy. --  Labwork: None ordered  Testing/Procedures: Your physician has recommended that you wear a 48 hour holter monitor. Holter monitors are medical devices that record the heart's electrical activity. Doctors most often use these monitors to diagnose arrhythmias. Arrhythmias are problems with the speed or rhythm of the heartbeat. The monitor is a small, portable device. You can wear one while you do your normal daily activities. This is usually used to diagnose what is causing palpitations/syncope (passing out).   Follow-Up: Your physician wants you to follow-up in: 1 month with APP   You will receive a reminder letter in the mail two months in advance. If you don't receive a letter, please call our office to schedule the follow-up appointment.  Thank you for choosing [facility]!!    Any Other Special Instructions Will Be Listed Below (If Applicable).

## 2017-12-20 NOTE — Progress Notes (Signed)
Follow-up Outpatient Visit Date: 12/20/2017  Primary Care Provider: Practice, [place] [address]  Chief Complaint: Shortness of breath  HPI:  Debra Barry is a 67 y.o. year-old female with history of moderate aortic and mitral regurgitation, hypertension, hyperlipidemia, impaired glucose tolerance, and hypothyroidism, who presents for follow-up of hypertension and valvular heart disease. I last saw Debra Barry August, which time she was feeling well. We did not make any medication changes at that time. Follow-up echo showed stable mild AS and moderate AI with improved pulmonary artery pressures compared to 08/2016 Today, Debra Barry reports that she has been under a lot of stress, caring for her sister and a friend. She feels like this has caused her blood pressure to go up. She denies chest pain but notices some exertional dyspnea and orthopnea, which is new. She has been trying to go the gym to exercise but is unable to walk on the treadmill for any significant length of time due to shortness of breath. Home BP has been 140-150/60-70. She notes that she is unable to tolerate BP's lower than 135/80 due to dizziness. She also reports that her HR at home is typically 50-60 and that it "shoots up" to 80-90 when she is active. She does not want to push her heart rate too high, as she is concerned that could cause her to have a heart attack.  Debra Barry is tolerating amlodipine, HCTZ, and losartan well. She was started an ezetimibe about a week ago by her PCP due to intolerance to multiple statins. However, she is concerned that the ezetimibe is causing headaches and nausea.  Debra Barry reports a 15 pound weight gain over the last 6 months. She has not noticed any swelling and believes that stress and inactivity are to blame for her weight gain.  --------------------------------------------------------------------------------------------------  Cardiovascular  History & Procedures: Cardiovascular Problems:  Palpitations, chest pain, shortness of breath  Labile hypertension  Risk Factors:  Hypertension and age greater than 15  Cath/PCI:  None  CV Surgery:  None  EP Procedures and Devices:  None  Non-Invasive Evaluation(s):  Pharmacologic myocardial perfusion stress test (09/11/16): Low risk study with small in size, moderate in severity, mid anterior defect that is partially reversible. LVEF 58%.  Transthoracic echocardiogram (08/27/16): Normal LV size and wall thickness. LVEF 60-65% with normal wall motion. Grade 2 diastolic dysfunction. Mild to moderate aortic regurgitation present. Mild mitral annular calcification is noted as well as mitral valve thickening. There is mild to moderate mitral regurgitation. Left atrium is moderately dilated. RV size and function are normal. There is mild pulmonary hypertension. Small circumferential pericardial effusion is present.  Renal artery Doppler (06/15/16): No evidence of renal artery stenosis. Small hyperechoic focus in the inferior right kidney may represent vascular calcification or a nonobstructive stone.  Past medical and surgical history were reviewed and updated in EPIC.  Current Meds  Medication Sig  . alendronate (FOSAMAX) 70 MG tablet Take 70 mg by mouth once a week. Take with a full glass of water on an empty stomach.  Marland Kitchen amLODipine (NORVASC) 10 MG tablet Take 10 mg by mouth daily.  Marland Kitchen aspirin EC 81 MG tablet Take 81 mg by mouth daily.  . cyclobenzaprine (FLEXERIL) 10 MG tablet Take 10 mg by mouth daily as needed.  . ezetimibe (ZETIA) 10 MG tablet daily.  . hydrochlorothiazide (MICROZIDE) 12.5 MG capsule 1 capsule (12.5 mg total) daily. Pt reports currently taking this medication  . levothyroxine (  SYNTHROID, LEVOTHROID) 75 MCG tablet   . losartan (COZAAR) 100 MG tablet Take 100 mg by mouth daily.  . multivitamin-lutein (OCUVITE-LUTEIN) CAPS capsule Take 1 capsule by mouth  daily.  . Omega-3 Fatty Acids (FISH OIL) 1000 MG CPDR Take 1 capsule by mouth 2 (two) times daily.  . Polyvinyl Alcohol-Povidone (REFRESH OP) Apply 1 drop to eye 4 (four) times daily as needed (dry eyes).   . Tetrahydrozoline HCl (EYE DROPS OP) Place 1 drop into both eyes at bedtime as needed. Refresh eye gel    Allergies: Codeine; Penicillins; Tricor [fenofibrate]; Verapamil; and Sulfa antibiotics  Social History   Socioeconomic History  . Marital status: Divorced    Spouse name: Not on file  . Number of children: Not on file  . Years of education: Not on file  . Highest education level: Not on file  Social Needs  . Financial resource strain: Not on file  . Food insecurity - worry: Not on file  . Food insecurity - inability: Not on file  . Transportation needs - medical: Not on file  . Transportation needs - non-medical: Not on file  Occupational History  . Not on file  Tobacco Use  . Smoking status: Former Smoker    Packs/day: 0.25    Types: Cigarettes    Last attempt to quit: 10/22/1974    Years since quitting: 43.1  . Smokeless tobacco: Never Used  Substance and Sexual Activity  . Alcohol use: No    Alcohol/week: 0.0 oz  . Drug use: No  . Sexual activity: Not on file  Other Topics Concern  . Not on file  Social History Narrative  . Not on file    Family History  Problem Relation Age of Onset  . Rectal cancer Father   . Hepatitis Father   . Hypertension Mother   . Hyperlipidemia Mother   . Glaucoma Mother   . Stroke Mother        TIA  . Heart disease Maternal Grandfather   . Colon cancer Neg Hx     Review of Systems: A 12-system review of systems was performed and was negative except as noted in the HPI.  --------------------------------------------------------------------------------------------------  Physical Exam: BP (!) 160/80 (BP Location: Left Arm, Patient Position: Sitting, Cuff Size: Normal)   Pulse (!) 52   Ht 5\' 4"  (1.626 m)   Wt 148 lb  (67.1 kg)   LMP  (LMP Unknown)   SpO2 96%   BMI 25.40 kg/m   General:  NAD. HEENT: No conjunctival pallor or scleral icterus. Moist mucous membranes.  OP clear. Neck: Supple without lymphadenopathy, thyromegaly, JVD, or HJR. Lungs: Normal work of breathing. Clear to auscultation bilaterally without wheezes or crackles. Heart: Bradycardic but regular with 2/6 systolic and 1/6 diastolic murmurs. No rubs. Non-displaced PMI. Abd: Bowel sounds present. Soft, NT/ND without hepatosplenomegaly Ext: No lower extremity edema. Radial, PT, and DP pulses are 2+ bilaterally. Skin: Warm and dry without rash.  EKG:  Sinus bradycardia (HR 50 bpm). Otherwise, no abnormalities.  Outside labs (12/05/17): CBC: WBC 5.6, HGB 13.7, HCT 38.0, PLT 204  CMP: Sodium 143, potassium 4.0, chloride 104, CO2 23, BUN 26, creatinine 1.3, glucose 95, calcium 9.8, AST 27, ALT 14, alkaline phosphatase 48, total bilirubin 0.4, total protein 6.9, albumin 4.7  Lipid panel: Total cholesterol 245, triglycerides 259, HDL 37, LDL 156  TSH: 2.680  --------------------------------------------------------------------------------------------------  ASSESSMENT AND PLAN: Aortic and mitral valve regurgitation Debra. Grima reports signs and symptoms of progressive heart failure,  including DOE, orthopnea, and weight gain. She is clinically euvolemic despite her weight being up 6 pounds since August. Recent echo showed preserved LVEF, stable valvular disease, and improved pulmonary artery pressure compared with 2017 We have agreed to increase HCTZ to 25 mg daily for improved BP control and gentle diuresis.  Bradycardia Sinus bradycardia noted today. Given fatigue with low heart rate in the absence of SA/AV nodal blocking agents, chronotropic incompitence is a consideration. We will obtain a 48-hour Holter monitor. Of note, recent TSH was normal.  Coronary artery disease without angina No chest pain, but DOE is concerning as possible  anginal equivalent. We discussed coronary CTA vs. LHC, given low risk but abnormal stress test in the past. Debra. Mui would like to defer this until our next visit, hoping that less stress and improved BP control with help her symptoms.  Essential hypertension BP poorly controlled today. We will increase HCTZ to 25 mg daily. Debra. Brys will need a BMP when she returns for follow-up in about 1 month.  Hyperlipidemia Cholesterol not well controlled, given history of coronary artery calcification. I agree with recent addition of ezetimibe, given statin intolerance. If her LDL remains suboptimally controlled (particulary if she develops angina or has evidence of obstructive CAD on further evaluation), we will need to consider adding a PCSK9 inhibitor.  Follow-up: Return to clinic in 1 month.  Nelva Bush, MD 12/20/2017 9:42 AM

## 2017-12-31 DIAGNOSIS — Z6824 Body mass index (BMI) 24.0-24.9, adult: Secondary | ICD-10-CM | POA: Diagnosis not present

## 2017-12-31 DIAGNOSIS — I2510 Atherosclerotic heart disease of native coronary artery without angina pectoris: Secondary | ICD-10-CM | POA: Diagnosis not present

## 2017-12-31 DIAGNOSIS — I10 Essential (primary) hypertension: Secondary | ICD-10-CM | POA: Diagnosis not present

## 2018-01-01 ENCOUNTER — Ambulatory Visit (INDEPENDENT_AMBULATORY_CARE_PROVIDER_SITE_OTHER): Payer: Medicare HMO

## 2018-01-01 DIAGNOSIS — I2510 Atherosclerotic heart disease of native coronary artery without angina pectoris: Secondary | ICD-10-CM

## 2018-01-01 DIAGNOSIS — R001 Bradycardia, unspecified: Secondary | ICD-10-CM | POA: Diagnosis not present

## 2018-01-15 ENCOUNTER — Encounter: Payer: Self-pay | Admitting: Internal Medicine

## 2018-01-26 NOTE — Progress Notes (Signed)
Follow-up Outpatient Visit Date: 01/27/2018  Primary Care Provider: Practice, [place] [address]  Chief Complaint: Shortness of breath  HPI:  Debra Barry is a 67 y.o. year-old female with history of moderate aortic and mitralregurgitation, hypertension, hyperlipidemia, impaired glucose tolerance, and hypothyroidism, who presents for follow-up of shortness of breath. I last saw Debra Barry a month ago, at which time she reported new exertional dyspnea and orthopnea, which she attributed to stress related to caring for her sister and a friend.  Today, Debra Barry reports that she is feeling better than at her last visit.  She still has some shortness of breath with exertion but is under less stress and is able to do more activities.  She denies chest pain, lightheadedness, and edema.  She has occasional palpitations, most often when lying in bed, that last several seconds.  Rolling over in bed often terminates the episodes.  Home blood pressures have continued to be labile but are typically in the 096G-836O systolic.  She did not tolerate HCTZ 25 mg daily due to ringing in her ears.  She is now back on 12.5 mg daily.  She was also recently switched from losartan to telmisartan due to the medication recall.  She is tolerating this well.  She is also tolerating ezetimibe and is scheduled for follow-up with her PCP with repeat labs in a few weeks.    --------------------------------------------------------------------------------------------------  Cardiovascular History & Procedures: Cardiovascular Problems:  Palpitations, chest pain, shortness of breath  Labile hypertension  Risk Factors:  Hypertension and age greater than 68  Cath/PCI:  None  CV Surgery:  None  EP Procedures and Devices:  48-Hour Holter Monitor (01/01/18): Predominantly sinus bradycardia with occasional PACs and atrial runs lasting up to 25 beats.  Non-Invasive  Evaluation(s):  Transthoracic echocardiogram (12/04/17): Mildly dilated LV with mild LVH. LVEF 55-60% with normal wall motion. Mild AS and moderate AI. Mild MR. Upper normal PA pressure with normal RV size and function. Trivial pericardial effusion (01/01/18): Predominantly sinus bradycardia with occasional PAC's and atrial runs lasting up to 25 beats.  Pharmacologic myocardial perfusion stress test (09/11/16): Low risk study with small in size, moderate in severity, mid anterior defect that is partially reversible. LVEF 58%.  Transthoracic echocardiogram (08/27/16): Normal LV size and wall thickness. LVEF 60-65% with normal wall motion. Grade 2 diastolic dysfunction. Mild to moderate aortic regurgitation present. Mild mitral annular calcification is noted as well as mitral valve thickening. There is mild to moderate mitral regurgitation. Left atrium is moderately dilated. RV size and function are normal. There is mild pulmonary hypertension. Small circumferential pericardial effusion is present.  Renal artery Doppler (06/15/16): No evidence of renal artery stenosis. Small hyperechoic focus in the inferior right kidney may represent vascular calcification or a nonobstructive stone.   Recent CV Pertinent Labs: Lab Results  Component Value Date   CHOL 135 09/27/2016   HDL 37 (L) 09/27/2016   LDLCALC 80 09/27/2016   TRIG 89 09/27/2016   CHOLHDL 3.6 09/27/2016   K 3.8 09/27/2016   BUN 43 (H) 09/27/2016   CREATININE 1.91 (H) 09/27/2016    Past medical and surgical history were reviewed and updated in EPIC.  Current Meds  Medication Sig  . alendronate (FOSAMAX) 70 MG tablet Take 70 mg by mouth once a week. Take with a full glass of water on an empty stomach.  Marland Kitchen amLODipine (NORVASC) 10 MG tablet Take 10 mg by mouth daily.  Marland Kitchen aspirin EC  81 MG tablet Take 81 mg by mouth daily.  . cyclobenzaprine (FLEXERIL) 10 MG tablet Take 10 mg by mouth daily as needed.  . hydrochlorothiazide (MICROZIDE) 12.5 MG  capsule 1 capsule (12.5 mg total) daily. Pt reports currently taking this medication  . levothyroxine (SYNTHROID, LEVOTHROID) 75 MCG tablet   . multivitamin-lutein (OCUVITE-LUTEIN) CAPS capsule Take 1 capsule by mouth daily.  . Omega-3 Fatty Acids (FISH OIL) 1000 MG CPDR Take 1 capsule by mouth 2 (two) times daily.  . Polyvinyl Alcohol-Povidone (REFRESH OP) Apply 1 drop to eye 4 (four) times daily as needed (dry eyes).   Marland Kitchen telmisartan (MICARDIS) 80 MG tablet Take 80 mg by mouth daily.  . Tetrahydrozoline HCl (EYE DROPS OP) Place 1 drop into both eyes at bedtime as needed. Refresh eye gel    Allergies: Codeine; Penicillins; Statins; Tricor [fenofibrate]; Verapamil; and Sulfa antibiotics  Social History   Socioeconomic History  . Marital status: Divorced    Spouse name: Not on file  . Number of children: Not on file  . Years of education: Not on file  . Highest education level: Not on file  Occupational History  . Not on file  Social Needs  . Financial resource strain: Not on file  . Food insecurity:    Worry: Not on file    Inability: Not on file  . Transportation needs:    Medical: Not on file    Non-medical: Not on file  Tobacco Use  . Smoking status: Former Smoker    Packs/day: 0.25    Types: Cigarettes    Last attempt to quit: 10/22/1974    Years since quitting: 43.2  . Smokeless tobacco: Never Used  Substance and Sexual Activity  . Alcohol use: No    Alcohol/week: 0.0 oz  . Drug use: No  . Sexual activity: Not on file  Lifestyle  . Physical activity:    Days per week: Not on file    Minutes per session: Not on file  . Stress: Not on file  Relationships  . Social connections:    Talks on phone: Not on file    Gets together: Not on file    Attends religious service: Not on file    Active member of club or organization: Not on file    Attends meetings of clubs or organizations: Not on file    Relationship status: Not on file  . Intimate partner violence:    Fear  of current or ex partner: Not on file    Emotionally abused: Not on file    Physically abused: Not on file    Forced sexual activity: Not on file  Other Topics Concern  . Not on file  Social History Narrative  . Not on file    Family History  Problem Relation Age of Onset  . Rectal cancer Father   . Hepatitis Father   . Hypertension Mother   . Hyperlipidemia Mother   . Glaucoma Mother   . Stroke Mother        TIA  . Heart disease Maternal Grandfather   . Colon cancer Neg Hx     Review of Systems: Ms. Haese reports a nonhealing spot on her nose.  This initially began as a whitehead a few months ago but has not healed.  Otherwise, a 12-system review of systems was performed and was negative except as noted in the HPI.  --------------------------------------------------------------------------------------------------  Physical Exam: BP 138/66   Pulse (!) 55   Ht 5' 4.5" (  1.638 m)   Wt 145 lb (65.8 kg)   LMP  (LMP Unknown)   SpO2 99%   BMI 24.50 kg/m   General: NAD. HEENT: No conjunctival pallor or scleral icterus. Moist mucous membranes.  OP clear. Neck: Supple without lymphadenopathy, thyromegaly, JVD, or HJR. No carotid bruit. Lungs: Normal work of breathing. Clear to auscultation bilaterally without wheezes or crackles. Heart: Bradycardic but regular with 3/6 crescendo-decrescendo systolic murmur loudest at the right upper sternal border.  No rubs or gallops.  Nondisplaced PMI. Abd: Bowel sounds present. Soft, NT/ND without hepatosplenomegaly Ext: No lower extremity edema. Radial, PT, and DP pulses are 2+ bilaterally. Skin: Warm and dry without rash.  Lab Results  Component Value Date   WBC 3.2 (L) 09/27/2016   HGB 10.2 (L) 09/27/2016   HCT 31.6 (L) 09/27/2016   MCV 94.0 09/27/2016   PLT 183 09/27/2016    Lab Results  Component Value Date   NA 138 09/27/2016   K 3.8 09/27/2016   CL 105 09/27/2016   CO2 24 09/27/2016   BUN 43 (H) 09/27/2016   CREATININE  1.91 (H) 09/27/2016   GLUCOSE 89 09/27/2016   ALT 33 (H) 09/27/2016    Lab Results  Component Value Date   CHOL 135 09/27/2016   HDL 37 (L) 09/27/2016   LDLCALC 80 09/27/2016   TRIG 89 09/27/2016   CHOLHDL 3.6 09/27/2016    --------------------------------------------------------------------------------------------------  ASSESSMENT AND PLAN: Mitral and aortic regurgitation and aortic stenosis Mixed valvular heart disease noted in the past, including mitral and aortic regurgitation as well as mild aortic stenosis.  Symptoms are stable.  Debra Barry appears euvolemic on exam today.  We will continue clinical follow-up.  Coronary artery disease without angina No symptoms to suggest worsening coronary insufficiency.  Myocardial perfusion stress test in 2017 was notable for possible anterior ischemia versus artifact.  Overall, study was low risk.  Given improved symptoms, we have agreed to defer.  Given statin intolerance, I agree with continuation of ezetimibe and close lipid follow-up by her PCP (goal LDL < 100, ideally <70).  Sinus bradycardia Mild bradycardia noted on exam today.  Recent Holter monitor showed predominantly sinus bradycardia with second R-R interval.  Appropriate heart rate response was noted with activity.  No further intervention at this time.  Paroxysmal atrial tachycardia Brief atrial runs lasting up to 25 beats were noted on recent Holter monitor.  Debra Barry notes occasional self-limiting palpitations.  In light of her baseline sinus bradycardia, I am hesitant to add a beta-blocker or calcium channel blocker.  If her symptoms become more pronounced, we will need to refer her to electrophysiology for further evaluation.  Hypertension Blood pressure is upper normal today.  Home readings are still somewhat uncontrolled at times.  However, Debra Barry reports being symptomatic if her systolic pressure drops below 130 mmHg.  We have therefore agreed to continue her  current regimen without any changes today.  Hyperlipidemia Continue ezetimibe 10 mg daily, given intolerance to multiple statins.  Follow-up lipid panel planned through Debra Barry's PCP the next few weeks.  Follow-up: Return to clinic in 4 months.  Nelva Bush, MD 01/27/2018 9:39 AM

## 2018-01-27 ENCOUNTER — Ambulatory Visit (INDEPENDENT_AMBULATORY_CARE_PROVIDER_SITE_OTHER): Payer: Medicare HMO | Admitting: Internal Medicine

## 2018-01-27 ENCOUNTER — Encounter: Payer: Self-pay | Admitting: Internal Medicine

## 2018-01-27 VITALS — BP 138/66 | HR 55 | Ht 64.5 in | Wt 145.0 lb

## 2018-01-27 DIAGNOSIS — I080 Rheumatic disorders of both mitral and aortic valves: Secondary | ICD-10-CM | POA: Diagnosis not present

## 2018-01-27 DIAGNOSIS — I350 Nonrheumatic aortic (valve) stenosis: Secondary | ICD-10-CM | POA: Diagnosis not present

## 2018-01-27 DIAGNOSIS — E782 Mixed hyperlipidemia: Secondary | ICD-10-CM

## 2018-01-27 DIAGNOSIS — I2510 Atherosclerotic heart disease of native coronary artery without angina pectoris: Secondary | ICD-10-CM

## 2018-01-27 DIAGNOSIS — I10 Essential (primary) hypertension: Secondary | ICD-10-CM

## 2018-01-27 DIAGNOSIS — I471 Supraventricular tachycardia: Secondary | ICD-10-CM | POA: Insufficient documentation

## 2018-01-27 DIAGNOSIS — I340 Nonrheumatic mitral (valve) insufficiency: Secondary | ICD-10-CM | POA: Insufficient documentation

## 2018-01-27 DIAGNOSIS — R001 Bradycardia, unspecified: Secondary | ICD-10-CM | POA: Diagnosis not present

## 2018-01-27 NOTE — Patient Instructions (Addendum)
Medication Instructions:  Your physician recommends that you continue on your current medications as directed. Please refer to the Current Medication list given to you today.  -- If you need a refill on your cardiac medications before your next appointment, please call your pharmacy. --  Labwork: None ordered  Testing/Procedures: None ordered  Follow-Up:  Your physician wants you to follow-up in: 4 months with Dr. End.      Thank you for choosing [facility]!!    Any Other Special Instructions Will Be Listed Below (If Applicable).         

## 2018-02-11 DIAGNOSIS — I10 Essential (primary) hypertension: Secondary | ICD-10-CM | POA: Diagnosis not present

## 2018-02-11 DIAGNOSIS — I2510 Atherosclerotic heart disease of native coronary artery without angina pectoris: Secondary | ICD-10-CM | POA: Diagnosis not present

## 2018-02-11 DIAGNOSIS — E782 Mixed hyperlipidemia: Secondary | ICD-10-CM | POA: Diagnosis not present

## 2018-02-11 DIAGNOSIS — Z6824 Body mass index (BMI) 24.0-24.9, adult: Secondary | ICD-10-CM | POA: Diagnosis not present

## 2018-02-11 DIAGNOSIS — E890 Postprocedural hypothyroidism: Secondary | ICD-10-CM | POA: Diagnosis not present

## 2018-02-21 DIAGNOSIS — C44311 Basal cell carcinoma of skin of nose: Secondary | ICD-10-CM | POA: Diagnosis not present

## 2018-02-21 DIAGNOSIS — L7211 Pilar cyst: Secondary | ICD-10-CM | POA: Diagnosis not present

## 2018-02-21 DIAGNOSIS — D225 Melanocytic nevi of trunk: Secondary | ICD-10-CM | POA: Diagnosis not present

## 2018-03-06 ENCOUNTER — Encounter (INDEPENDENT_AMBULATORY_CARE_PROVIDER_SITE_OTHER): Payer: Self-pay

## 2018-03-06 ENCOUNTER — Ambulatory Visit (INDEPENDENT_AMBULATORY_CARE_PROVIDER_SITE_OTHER): Payer: Medicare HMO | Admitting: Pharmacist

## 2018-03-06 ENCOUNTER — Telehealth: Payer: Self-pay | Admitting: Pharmacist

## 2018-03-06 DIAGNOSIS — E782 Mixed hyperlipidemia: Secondary | ICD-10-CM | POA: Diagnosis not present

## 2018-03-06 MED ORDER — ALIROCUMAB 75 MG/ML ~~LOC~~ SOPN: 1.0000 "pen " | PEN_INJECTOR | SUBCUTANEOUS | 11 refills | Status: DC

## 2018-03-06 NOTE — Patient Instructions (Addendum)
Increase your fish oil to 2 grams twice a day - this will help to lower your triglycerides   I will submit paperwork for your injectable cholesterol medication  If you have records of the specific cholesterol medication you've tried, call Laronda Lisby at [phone]

## 2018-03-06 NOTE — Progress Notes (Signed)
Patient ID: Debra Barry                 DOB: 04-18-1951                    MRN: [id number]     HPI: Debra Barry is a 67 y.o. female patient referred to lipid clinic by Dr End. PMH is significant for CAD, abnormal stress test with suggestion of anterior ischemia, HTN, HLD, impaired glucose tolerance, moderate aortic and mitral regurgitation, and hypothyroidism. She has a history of statin intolerance and presents today for further management.  Patient reports trying multiple statins in the past, including pravastatin, rosuvastatin, and atorvastatin. She experienced myalgias on each statin within a week of therapy initiation in each case. Her symptoms resolved upon statin discontinuation. She reports her sister took statins and "was paralyzed for 8 months." She is currently tolerating her Zetia which has improved her LDL from 156 to 117. She reports losing 30 lbs but has gained back 10.  Current Medications: Zetia 10mg  daily, fish oil 1g BID Intolerances: statins - muscle weakness Risk Factors: CAD, abnormal stress test with suggestion of anterior ischemia, HTN, family history LDL goal: 70mg /dL  Diet: Eats fish, vegetables, limits red meat to once a week. Does not eat fried food. Only drinks water. Stopped drinking sodas. Does not add salt to food.   Exercise: Walks frequently, does yoga, goes to the gym once a week.  Family History: Mother with history of HTN and TIA. Maternal grandfather with heart disease.  Social History: Former smoker, quit in 1976. Denies alcohol and illicit drug use.  Labs: 02/11/18: TC 217, TG 319, HDL 36, LDL 117 (Zetia 10mg  daily) 12/05/17: TC 245, TG 259, HDL 37, LDL 156 (no therapy)  Past Medical History:  Diagnosis Date  . Arthritis   . Complication of anesthesia   . Heart murmur   . Hx of adenomatous polyp of colon 08/11/2015  . Hyperlipidemia   . Hypertension   . Hypothyroidism   . Impaired glucose tolerance   . Patella fracture right    2014    . Plantar fascial fibromatosis    left foot  . PONV (postoperative nausea and vomiting)     Current Outpatient Medications on File Prior to Visit  Medication Sig Dispense Refill  . alendronate (FOSAMAX) 70 MG tablet Take 70 mg by mouth once a week. Take with a full glass of water on an empty stomach.    Marland Kitchen amLODipine (NORVASC) 10 MG tablet Take 10 mg by mouth daily.    Marland Kitchen aspirin EC 81 MG tablet Take 81 mg by mouth daily.    . cyclobenzaprine (FLEXERIL) 10 MG tablet Take 10 mg by mouth daily as needed.    . ezetimibe (ZETIA) 10 MG tablet daily.    . hydrochlorothiazide (MICROZIDE) 12.5 MG capsule 1 capsule (12.5 mg total) daily. Pt reports currently taking this medication    . levothyroxine (SYNTHROID, LEVOTHROID) 75 MCG tablet     . multivitamin-lutein (OCUVITE-LUTEIN) CAPS capsule Take 1 capsule by mouth daily.    . Omega-3 Fatty Acids (FISH OIL) 1000 MG CPDR Take 1 capsule by mouth 2 (two) times daily.    . Polyvinyl Alcohol-Povidone (REFRESH OP) Apply 1 drop to eye 4 (four) times daily as needed (dry eyes).     Marland Kitchen telmisartan (MICARDIS) 80 MG tablet Take 80 mg by mouth daily.    . Tetrahydrozoline HCl (EYE DROPS OP) Place 1 drop into both eyes  at bedtime as needed. Refresh eye gel     No current facility-administered medications on file prior to visit.     Allergies  Allergen Reactions  . Codeine Nausea And Vomiting  . Penicillins Hives  . Statins     Muscle weakness   . Tricor [Fenofibrate] Other (See Comments)    Leg pain   . Verapamil Other (See Comments)    Nausea and dizziness  . Sulfa Antibiotics Rash    Assessment/Plan:  1. Hyperlipidemia - LDL 117 above goal 70mg /dL due to history of CAD. Pt is tolerating Zetia well, however is intolerant to pravastatin, Crestor, and Lipitor (myalgias with each). Discussed PCKS9i therapy which patient is interested in. Will submit prior authorization for Praluent, cost should be affordable since pt has both Medicare and Medicaid.  Will also increase fish oil to 2g BID due to elevated TG > 300. Will follow up with pt after approval.   Trevyn Lumpkin E. Comfort Iversen, PharmD, CPP, [facility] [address] [place], [place] [address] Phone: [phone]; Fax: [phone] 03/06/2018 11:33 AM

## 2018-03-06 NOTE — Telephone Encounter (Signed)
Prior authorization for Praluent has been approved through 09/02/18. Rx sent to local pharmacy per patient preference. She is aware to contact clinic once she receives her first shipment.

## 2018-03-10 DIAGNOSIS — Z6824 Body mass index (BMI) 24.0-24.9, adult: Secondary | ICD-10-CM | POA: Diagnosis not present

## 2018-03-10 DIAGNOSIS — M549 Dorsalgia, unspecified: Secondary | ICD-10-CM | POA: Diagnosis not present

## 2018-03-25 DIAGNOSIS — C44311 Basal cell carcinoma of skin of nose: Secondary | ICD-10-CM | POA: Diagnosis not present

## 2018-04-04 ENCOUNTER — Telehealth: Payer: Self-pay | Admitting: Pharmacist

## 2018-04-04 NOTE — Telephone Encounter (Signed)
LMOM for pt to schedule f/u lipid panel since starting Praluent injections.

## 2018-04-07 NOTE — Telephone Encounter (Addendum)
Spoke with pt, she states she does not want to start Praluent at this time. At first she states that the medication would not work because her cholesterol has always been high. Discussed that Praluent will lower her LDL 60% and will bring her LDL to goal < 70. She still does not want to start therapy, and instead prefers to wait until her PCP rechecks her cholesterol in August. Explained that her LDL was 117 in April on Zetia and she has not had any other medication changes since then, so her LDL will likely remain about the same in August. She still refuses to start Praluent at this time but does not express why. She states her PCP is aware that we are trying to lower her LDL to < 70 and that he is fine with her not starting Praluent at this time.   I have already completed the insurance authorization for Praluent with a $3 copay, however was unable to convince patient to start Praluent at this time. Will route to Dr End as an Juluis Rainier.

## 2018-04-08 NOTE — Telephone Encounter (Signed)
Thank you for the update.  I will discuss this with Ms. Mcquillen at her f/u visit.  Nelva Bush, MD [facility] Pager: [phone]

## 2018-05-08 DIAGNOSIS — C44311 Basal cell carcinoma of skin of nose: Secondary | ICD-10-CM | POA: Diagnosis not present

## 2018-05-08 DIAGNOSIS — Z1339 Encounter for screening examination for other mental health and behavioral disorders: Secondary | ICD-10-CM | POA: Diagnosis not present

## 2018-05-08 DIAGNOSIS — Z6824 Body mass index (BMI) 24.0-24.9, adult: Secondary | ICD-10-CM | POA: Diagnosis not present

## 2018-05-26 ENCOUNTER — Ambulatory Visit: Payer: Medicare HMO | Admitting: Internal Medicine

## 2018-06-05 DIAGNOSIS — I10 Essential (primary) hypertension: Secondary | ICD-10-CM | POA: Diagnosis not present

## 2018-06-05 DIAGNOSIS — C44311 Basal cell carcinoma of skin of nose: Secondary | ICD-10-CM | POA: Diagnosis not present

## 2018-06-05 DIAGNOSIS — Z6824 Body mass index (BMI) 24.0-24.9, adult: Secondary | ICD-10-CM | POA: Diagnosis not present

## 2018-06-05 DIAGNOSIS — E782 Mixed hyperlipidemia: Secondary | ICD-10-CM | POA: Diagnosis not present

## 2018-06-05 DIAGNOSIS — N183 Chronic kidney disease, stage 3 (moderate): Secondary | ICD-10-CM | POA: Diagnosis not present

## 2018-06-05 DIAGNOSIS — I2510 Atherosclerotic heart disease of native coronary artery without angina pectoris: Secondary | ICD-10-CM | POA: Diagnosis not present

## 2018-06-05 DIAGNOSIS — R7303 Prediabetes: Secondary | ICD-10-CM | POA: Diagnosis not present

## 2018-06-05 DIAGNOSIS — M810 Age-related osteoporosis without current pathological fracture: Secondary | ICD-10-CM | POA: Diagnosis not present

## 2018-06-05 DIAGNOSIS — E890 Postprocedural hypothyroidism: Secondary | ICD-10-CM | POA: Diagnosis not present

## 2018-06-10 ENCOUNTER — Telehealth: Payer: Self-pay | Admitting: Internal Medicine

## 2018-06-10 NOTE — Telephone Encounter (Signed)
Spoke to patient and she is ready to start Praluent.  She wanted to think about it initially and check on insurance coverage, but after reviewing, she has made the decision to start.  Please advise, thank you.

## 2018-06-10 NOTE — Telephone Encounter (Signed)
New Message    Pt c/o medication issue:  1. Name of Medication: Praluent  2. How are you currently taking this medication (dosage and times per day)? 75mg   3. Are you having a reaction (difficulty breathing--STAT)? none  4. What is your medication issue? Patient is calling because she has agreed to start Praulent at this time. She is hoping that Dr. Saunders Revel will send the request.

## 2018-06-11 NOTE — Telephone Encounter (Signed)
Prior auth already complete and prescription sent to [place]. She should be able to pick up from pharmacy. We will want to check labs after her 3rd or 4th dose. She can contact the clinic after she starts or if she would like to come to the clinic to do her first injection.

## 2018-06-12 ENCOUNTER — Other Ambulatory Visit: Payer: Self-pay

## 2018-06-12 DIAGNOSIS — E785 Hyperlipidemia, unspecified: Secondary | ICD-10-CM

## 2018-06-12 NOTE — Telephone Encounter (Signed)
Spoke to patient with instructions and offered her assistance, if needed.  She will go to her 45 office and get a nurse's assistance.  Labs are arranged.

## 2018-06-17 ENCOUNTER — Telehealth: Payer: Self-pay

## 2018-06-17 NOTE — Telephone Encounter (Signed)
Notes recorded by Frederik Schmidt, RN on 06/17/2018 at 4:21 PM EDT Spoke to patient and she took the 1st Praluent injection on Sunday. She said that it went fine and she will keep [place] updated on her progress.

## 2018-06-17 NOTE — Telephone Encounter (Signed)
-----   Message from Nelva Bush, MD sent at 06/17/2018  1:12 PM EDT ----- CBC normal.  Renal function mildly decreased but near baseline.  Lipids still suboptimally controlled and worse than a few months ago.  She recently was seen in the lipid clinic but ultimately declined Praluent.  Given that her lipids are still high, I recommend that she reconsider PCSK9 therapy.  It is very effective at lowering LDL and hopefully can work well for her to prevent progression of her disease.

## 2018-06-17 NOTE — Telephone Encounter (Signed)
Notes recorded by Frederik Schmidt, RN on 06/17/2018 at 1:41 PM EDT [id number] 8/27 ------

## 2018-06-17 NOTE — Telephone Encounter (Signed)
Great.  Thank you for the update.  Nelva Bush, MD [facility] Pager: [phone]

## 2018-07-12 ENCOUNTER — Emergency Department (HOSPITAL_COMMUNITY): Payer: Medicare HMO

## 2018-07-12 ENCOUNTER — Encounter (HOSPITAL_COMMUNITY): Payer: Self-pay | Admitting: *Deleted

## 2018-07-12 ENCOUNTER — Emergency Department (HOSPITAL_COMMUNITY)
Admission: EM | Admit: 2018-07-12 | Discharge: 2018-07-12 | Disposition: A | Discharge status: Home / Self Care | Payer: Medicare HMO | Attending: Emergency Medicine | Admitting: Emergency Medicine

## 2018-07-12 DIAGNOSIS — M79672 Pain in left foot: Secondary | ICD-10-CM | POA: Diagnosis not present

## 2018-07-12 DIAGNOSIS — E039 Hypothyroidism, unspecified: Secondary | ICD-10-CM | POA: Insufficient documentation

## 2018-07-12 DIAGNOSIS — Z87891 Personal history of nicotine dependence: Secondary | ICD-10-CM | POA: Diagnosis not present

## 2018-07-12 DIAGNOSIS — I10 Essential (primary) hypertension: Secondary | ICD-10-CM | POA: Diagnosis not present

## 2018-07-12 DIAGNOSIS — Z79899 Other long term (current) drug therapy: Secondary | ICD-10-CM | POA: Diagnosis not present

## 2018-07-12 DIAGNOSIS — Z7982 Long term (current) use of aspirin: Secondary | ICD-10-CM | POA: Insufficient documentation

## 2018-07-12 DIAGNOSIS — M7752 Other enthesopathy of left foot: Secondary | ICD-10-CM | POA: Insufficient documentation

## 2018-07-12 DIAGNOSIS — M7672 Peroneal tendinitis, left leg: Secondary | ICD-10-CM | POA: Diagnosis not present

## 2018-07-12 DIAGNOSIS — M25572 Pain in left ankle and joints of left foot: Secondary | ICD-10-CM | POA: Diagnosis not present

## 2018-07-12 NOTE — Discharge Instructions (Signed)
You were seen in the ER for left foot pain.  X-ray shows fracture of ossicle in outside aspect of left foot. This is likely from a peroneum tendon injury that chipped a piece of bone.    Wear your cam walker at all time. Ice. Rest. Elevate. Take acetaminophen 500 mg every 6 hours for the next 3-5 days to help with inflammatory response.   Return to ER for worsening swelling, redness, warmth, purple discoloration, calf pain.

## 2018-07-12 NOTE — ED Triage Notes (Signed)
Pt complains of left ankle and foot pain since yesterday. Pt states she put down a case of water, then stood up straight and felt a pop in her left ankle. Pt was not moving her foot at the time the pain started. Pain is worse during dorsiflexion.

## 2018-07-12 NOTE — ED Provider Notes (Signed)
[place] DEPT Provider Note   CSN: [id number] Arrival date & time: 07/12/18  1321     History   Chief Complaint Chief Complaint  Patient presents with  . Ankle Pain  . Foot Pain    HPI Debra Barry is a 67 y.o. female w/ pertinent pmh of left plantar fasciitis s/p endoscopic plantar fascia release 2017 by Dr Percell Miller is here for evaluation of sudden onset, gradually worsening persistent left foot/heel pain. Onset yesterday while she was standing up.  She felt a sudden pop and severe pain.  Associated with local bruising. Pain is moderate, worse with weight bearing and dorsiflexion. Has taken acetaminophen and iced without significant relief.   No associated edema. No proximal calf edema, tenderness, erythema, warmth. No distal paresthesias or loss of sensation. No h/o DVT/PE.  States she has been walking a lot lately and going up/down stairs due to recent move. Has been wearing athletic shoes with heel support.   HPI  Past Medical History:  Diagnosis Date  . Arthritis   . Complication of anesthesia   . Heart murmur   . Hx of adenomatous polyp of colon 08/11/2015  . Hyperlipidemia   . Hypertension   . Hypothyroidism   . Impaired glucose tolerance   . Patella fracture right    2014  . Plantar fascial fibromatosis    left foot  . PONV (postoperative nausea and vomiting)     Patient Active Problem List   Diagnosis Date Noted  . Mild mitral and aortic regurgitation 01/27/2018  . Aortic valve stenosis 01/27/2018  . Sinus bradycardia 01/27/2018  . Paroxysmal atrial tachycardia ([place]) 01/27/2018  . Mixed hyperlipidemia 01/27/2018  . Coronary artery disease involving native coronary artery of native heart without angina pectoris 02/25/2017  . Valvular heart disease 02/25/2017  . Essential hypertension 02/25/2017  . Hypertensive heart disease 09/26/2016  . Hx of adenomatous polyp of colon 08/11/2015  . Family history of colon cancer - father  + sister both in 38's 08/11/2015    Past Surgical History:  Procedure Laterality Date  . APPENDECTOMY  1981  . COLON RESECTION  1985   benign tumor  . FOOT SURGERY Left 2003  . patella fracture repair Left 2007  . PLANTAR FASCIA RELEASE Left 02/09/2016   Procedure: LEFT FOOT ENDOSCOPIC PLANTAR FASCIOTOMY;  Surgeon: [name], MD;  Location: [facility];  Service: Orthopedics;  Laterality: Left;  . THYROIDECTOMY  1995  . VAGINAL HYSTERECTOMY  1983     OB History   None      Home Medications    Prior to Admission medications   Medication Sig Start Date End Date Taking? Authorizing Provider  alendronate (FOSAMAX) 70 MG tablet Take 70 mg by mouth once a week. Take with a full glass of water on an empty stomach.    [provider]  Alirocumab (PRALUENT) 75 MG/ML SOPN Inject 1 pen into the skin every 14 (fourteen) days. 03/06/18   End, Harrell Gave, MD  amLODipine (NORVASC) 10 MG tablet Take 10 mg by mouth daily.    [provider]  aspirin EC 81 MG tablet Take 81 mg by mouth daily.    [provider]  cyclobenzaprine (FLEXERIL) 10 MG tablet Take 10 mg by mouth daily as needed. 02/05/17   [provider]  ezetimibe (ZETIA) 10 MG tablet daily. 12/09/17   [provider]  hydrochlorothiazide (MICROZIDE) 12.5 MG capsule 1 capsule (12.5 mg total) daily. Pt reports currently  taking this medication 11/22/16   End, Harrell Gave, MD  levothyroxine (SYNTHROID, LEVOTHROID) 75 MCG tablet  12/16/17   [provider]  multivitamin-lutein (OCUVITE-LUTEIN) CAPS capsule Take 1 capsule by mouth daily.    [provider]  Omega-3 Fatty Acids (FISH OIL) 1000 MG CPDR Take 2 capsules by mouth 2 (two) times daily. 03/06/18   End, Harrell Gave, MD  Polyvinyl Alcohol-Povidone (REFRESH OP) Apply 1 drop to eye 4 (four) times daily as needed (dry eyes).     [provider]  telmisartan (MICARDIS) 80 MG tablet Take 80 mg by mouth  daily.    [provider]  Tetrahydrozoline HCl (EYE DROPS OP) Place 1 drop into both eyes at bedtime as needed. Refresh eye gel    [provider]    Family History Family History  Problem Relation Age of Onset  . Rectal cancer Father   . Hepatitis Father   . Hypertension Mother   . Hyperlipidemia Mother   . Glaucoma Mother   . Stroke Mother        TIA  . Heart disease Maternal Grandfather   . Colon cancer Neg Hx     Social History Social History   Tobacco Use  . Smoking status: Former Smoker    Packs/day: 0.25    Types: Cigarettes    Last attempt to quit: 10/22/1974    Years since quitting: 43.7  . Smokeless tobacco: Never Used  Substance Use Topics  . Alcohol use: No    Alcohol/week: 0.0 standard drinks  . Drug use: No     Allergies   Codeine; Penicillins; Statins; Tricor [fenofibrate]; Verapamil; and Sulfa antibiotics   Review of Systems Review of Systems  Musculoskeletal: Positive for arthralgias and gait problem.  Skin: Positive for color change.  All other systems reviewed and are negative.    Physical Exam Updated Vital Signs BP (!) 181/62 (BP Location: Right Arm)   Pulse (!) 54   Resp 16   LMP  (LMP Unknown)   SpO2 98%   Physical Exam  Constitutional: She is oriented to person, place, and time. She appears well-developed and well-nourished.  Non-toxic appearance.  HENT:  Head: Normocephalic.  Right Ear: External ear normal.  Left Ear: External ear normal.  Nose: Nose normal.  Eyes: Conjunctivae and EOM are normal.  Neck: Full passive range of motion without pain.  Cardiovascular: Normal rate.  1+ DP, PT and popliteal pulses bilaterally. No LE/calf edema or tenderness.   Pulmonary/Chest: Effort normal. No tachypnea. No respiratory distress.  Musculoskeletal: Normal range of motion. She exhibits tenderness.  Left foot/ankle: Moderate tenderness to proximal attachment of plantar fascia at calcaneus attachment, most significant  at lateral sole, lateral proximal foot and area below lateral malleolus. Mild ecchymosis to lateral aspect of sole and below lateral malleolus.  Full PROM of ankle. Pain with dorsiflexion and inversion. No focal bony tenderness to toes, MTPs, metatarsals, calcaneous, medial malleolus or proximal tib/fib. 5/5 strength with ankle dorsiflexion and extension against resistance.   Neurological: She is alert and oriented to person, place, and time.  Sensation to light touch intact in left foot. Strength intact.   Skin: Skin is warm and dry. Capillary refill takes less than 2 seconds.  Psychiatric: Her behavior is normal. Thought content normal.     ED Treatments / Results  Labs (all labs ordered are listed, but only abnormal results are displayed) Labs Reviewed - No data to display  EKG None  Radiology Dg Ankle Complete Left  Result Date: 07/12/2018 CLINICAL DATA:  Lateral ankle and foot pain after feeling a pop when bending over yesterday. EXAM: LEFT ANKLE COMPLETE - 3+ VIEW COMPARISON:  Ankle MRI 10/18/2015. FINDINGS: As described on the foot radiographs, there is suspicion of a distracted fracture of an os peroneum. There is mild spurring of the medial malleolus. No evidence of acute fracture or dislocation at the ankle. No focal soft tissue swelling. IMPRESSION: No acute findings at the ankle. Suspected fracture of an os peroneum. See separate foot report. Electronically Signed   By: Richardean Sale M.D.   On: 07/12/2018 15:06   Dg Foot Complete Left  Result Date: 07/12/2018 CLINICAL DATA:  Lateral ankle and foot pain after feeling a pop when bending over yesterday. EXAM: LEFT FOOT - COMPLETE 3+ VIEW COMPARISON:  Ankle MRI 10/18/2015. FINDINGS: The bones appear adequately mineralized. There are minimal degenerative changes at the 1st metatarsophalangeal joint. Adjacent to the cuboid, there are 2 ossific densities which could represent a multicentric os peroneum. However, only 1 ossicle is  apparent on the previous MRI, and the appearance is suggestive of a fractured ossicle with distraction mediated by the peroneus longus tendon. No other evidence of acute fracture. Small calcaneal spurs are noted. IMPRESSION: Suspected fracture of an os peroneum with distraction. This would predispose to peroneus longus tendon dysfunction. Electronically Signed   By: Richardean Sale M.D.   On: 07/12/2018 15:04    Procedures Procedures (including critical care time)  Medications Ordered in ED Medications - No data to display   Initial Impression / Assessment and Plan / ED Course  I have reviewed the triage vital signs and the nursing notes.  Pertinent labs & imaging results that were available during my care of the patient were reviewed by me and considered in my medical decision making (see chart for details).  Clinical Course as of Jul 12 1600  Sat Jul 12, 2018  1509 IMPRESSION: Suspected fracture of an os peroneum with distraction. This would predispose to peroneus longus tendon dysfunction.  DG Foot Complete Left [CG]    Clinical Course User Index [CG] Kinnie Feil, PA-C   67 year old here with atraumatic left lateral foot/sole pain.  In setting of recent increased walking and walking up steps.  She has previous plantar fasciitis after release in 2017 by Dr. Percell Miller.  On exam extremities neurovascularly intact.  Moderate tenderness to proximal attachment of plantar fasciitis and lateral malleolus.  There is no unilateral lower leg edema, calf tenderness.  Considered DVT however this is less likely given clinical presentation and exam.  There is no signs of cellulitis or an infection of the joint.  I doubt septic arthritis, gout.  X-ray confirms fracture of os peroneum which fits clinical exam.  Discussed outpatient treatment with Dr Mardelle Matte.  Will discharge with cam walker which patient already has at home, rice protocol, acetaminophen.  Discussed patient needs to contact orthopedic  clinic on Monday for appropriate outpatient management.  Discussed return precautions.  Patient and relative at bedside verbalized understanding and are comfortable with the plan.  Final Clinical Impressions(s) / ED Diagnoses   Final diagnoses:  Os peroneum syndrome of left foot    ED Discharge Orders    None       Arlean Hopping 07/12/18 1601    Davonna Belling, MD 07/12/18 [id number]

## 2018-07-20 NOTE — Progress Notes (Signed)
Follow-up Outpatient Visit Date: 07/21/2018  Primary Care Provider: [facility], [place] [id number]  Chief Complaint: Follow-up hypertension, CAD, and valvular heart disease  HPI:  Debra Barry is a 67 y.o. year-old female with history of moderate aortic and mitralregurgitation, paroxysmal atrial tachycardia, hypertension, hyperlipidemia, impaired glucose tolerance, and hypothyroidism, who presents for follow-up of valvular heart disease.  I last saw Debra Barry in April, at which time she noted improved shortness of breath and rare palpitations when lying in bed.  Preceding Holter monitor showed brief runs of atrial tachycardia.  We discussed referral to EP but agreed to defer this given limited symptoms.  Today, Debra Barry is doing well other than a recent injury to her left foot after moving heavy furniture and stepping awkwardly.  She now is wearing a rigid brace for a couple of months due to an os perineum fracture.  She has otherwise been doing well, denying chest pain, shortness of breath, palpitations, lightheadedness, and edema.  Her blood pressure has remained somewhat labile but overall is under good control.  She has previously not tolerated blood pressures less than 631 systolic due to lightheadedness.  Debra Barry began using Praluent last month, having now taken 3 doses.  She is tolerating this well without side effects. --------------------------------------------------------------------------------------------------  Cardiovascular History & Procedures: Cardiovascular Problems:  Aortic regurgitation  Mild artery disease by stress test  Paroxysmal atrial tachycardia  Labile hypertension  Risk Factors:  Hypertension and age greater than 3  Cath/PCI:  None  CV Surgery:  None  EP Procedures and Devices:  48-Hour Holter Monitor (01/01/18): Predominantly sinus bradycardia with occasional PACs and atrial runs lasting  up to 25 beats.  Non-Invasive Evaluation(s):  Transthoracic echocardiogram (12/04/17): Mildly dilated LV with mild LVH. LVEF 55-60% with normal wall motion. Mild AS and moderate AI. Mild MR. Upper normal PA pressure with normal RV size and function. Trivial pericardial effusion (01/01/18): Predominantly sinus bradycardia with occasional PAC's and atrial runs lasting up to 25 beats.  Pharmacologic myocardial perfusion stress test (09/11/16): Low risk study with small in size, moderate in severity, mid anterior defect that is partially reversible. LVEF 58%.  Transthoracic echocardiogram (08/27/16): Normal LV size and wall thickness. LVEF 60-65% with normal wall motion. Grade 2 diastolic dysfunction. Mild to moderate aortic regurgitation present. Mild mitral annular calcification is noted as well as mitral valve thickening. There is mild to moderate mitral regurgitation. Left atrium is moderately dilated. RV size and function are normal. There is mild pulmonary hypertension. Small circumferential pericardial effusion is present.  Renal artery Doppler (06/15/16): No evidence of renal artery stenosis. Small hyperechoic focus in the inferior right kidney may represent vascular calcification or a nonobstructive stone.  Recent CV Pertinent Labs: Lab Results  Component Value Date   CHOL 135 09/27/2016   HDL 37 (L) 09/27/2016   LDLCALC 80 09/27/2016   TRIG 89 09/27/2016   CHOLHDL 3.6 09/27/2016   K 3.8 09/27/2016   BUN 43 (H) 09/27/2016   CREATININE 1.91 (H) 09/27/2016    Past medical and surgical history were reviewed and updated in EPIC.  Current Meds  Medication Sig  . alendronate (FOSAMAX) 70 MG tablet Take 70 mg by mouth once a week. Take with a full glass of water on an empty stomach.  . Alirocumab (PRALUENT) 75 MG/ML SOPN Inject 1 pen into the skin every 14 (fourteen) days.  Marland Kitchen amLODipine (NORVASC) 10 MG tablet Take 10 mg by mouth daily.  Marland Kitchen  aspirin EC 81 MG tablet Take 81 mg by mouth daily.    . cyclobenzaprine (FLEXERIL) 10 MG tablet Take 10 mg by mouth daily as needed.  . ezetimibe (ZETIA) 10 MG tablet daily.  . hydrochlorothiazide (MICROZIDE) 12.5 MG capsule 1 capsule (12.5 mg total) daily. Pt reports currently taking this medication  . levothyroxine (SYNTHROID, LEVOTHROID) 75 MCG tablet   . multivitamin-lutein (OCUVITE-LUTEIN) CAPS capsule Take 1 capsule by mouth daily.  . Omega-3 Fatty Acids (FISH OIL) 1000 MG CPDR Take 2 capsules by mouth 2 (two) times daily.  . Polyvinyl Alcohol-Povidone (REFRESH OP) Apply 1 drop to eye 4 (four) times daily as needed (dry eyes).   Marland Kitchen telmisartan (MICARDIS) 80 MG tablet Take 80 mg by mouth daily.  . Tetrahydrozoline HCl (EYE DROPS OP) Place 1 drop into both eyes at bedtime as needed. Refresh eye gel    Allergies: Codeine; Penicillins; Statins; Tricor [fenofibrate]; Verapamil; and Sulfa antibiotics  Social History   Tobacco Use  . Smoking status: Former Smoker    Packs/day: 0.25    Types: Cigarettes    Last attempt to quit: 10/22/1974    Years since quitting: 43.7  . Smokeless tobacco: Never Used  Substance Use Topics  . Alcohol use: No    Alcohol/week: 0.0 standard drinks  . Drug use: No    Family History  Problem Relation Age of Onset  . Rectal cancer Father   . Hepatitis Father   . Hypertension Mother   . Hyperlipidemia Mother   . Glaucoma Mother   . Stroke Mother        TIA  . Heart disease Maternal Grandfather   . Colon cancer Neg Hx     Review of Systems: A 12-system review of systems was performed and was negative except as noted in the HPI.  --------------------------------------------------------------------------------------------------  Physical Exam: BP (!) 142/66   Pulse (!) 50   Ht 5\' 4"  (1.626 m)   Wt 142 lb (64.4 kg)   LMP  (LMP Unknown)   BMI 24.37 kg/m   General: NAD. HEENT: No conjunctival pallor or scleral icterus. Moist mucous membranes.  OP clear. Neck: Supple without lymphadenopathy,  thyromegaly, JVD, or HJR. Lungs: Normal work of breathing. Clear to auscultation bilaterally without wheezes or crackles. Heart: Regular rate and rhythm without murmurs, rubs, or gallops. Non-displaced PMI. Abd: Bowel sounds present. Soft, NT/ND without hepatosplenomegaly Ext: No right lower extremity edema.  Left foot and calf covered by rigid brace. Skin: Warm and dry without rash.  EKG: Sinus bradycardia (heart rate 50 bpm) with isolated PAC.  Poor R wave progression.  Lab Results  Component Value Date   WBC 3.2 (L) 09/27/2016   HGB 10.2 (L) 09/27/2016   HCT 31.6 (L) 09/27/2016   MCV 94.0 09/27/2016   PLT 183 09/27/2016    Lab Results  Component Value Date   NA 138 09/27/2016   K 3.8 09/27/2016   CL 105 09/27/2016   CO2 24 09/27/2016   BUN 43 (H) 09/27/2016   CREATININE 1.91 (H) 09/27/2016   GLUCOSE 89 09/27/2016   ALT 33 (H) 09/27/2016    Lab Results  Component Value Date   CHOL 135 09/27/2016   HDL 37 (L) 09/27/2016   LDLCALC 80 09/27/2016   TRIG 89 09/27/2016   CHOLHDL 3.6 09/27/2016    --------------------------------------------------------------------------------------------------  ASSESSMENT AND PLAN: Coronary artery disease without angina No symptoms to suggest worsening coronary insufficiency.  Given the lack of new symptoms and low risk stress test, we  will continue with medical therapy and secondary prevention.  Paroxysmal atrial tachycardia No symptoms.  Continue observation.  Unable to add beta-blocker or non-dihydropyridine calcium channel blocker due to resting bradycardia.  Hypertension Blood pressure mildly elevated today.  Given difficulties with symptomatic hypotension with even normal systolic blood pressures, we will defer medication changes at this time.  Hyperlipidemia Goal LDL less than 70.  Her lipids were suboptimally controlled with ezetimibe alone.  She has now taken 3 doses of Praluent, which she is tolerating well.  We will recheck  a lipid panel and ALT today.  Follow-up: Return to clinic in 6 months to see me in [place].  Nelva Bush, MD 07/21/2018 1:15 PM

## 2018-07-21 ENCOUNTER — Encounter

## 2018-07-21 ENCOUNTER — Ambulatory Visit (INDEPENDENT_AMBULATORY_CARE_PROVIDER_SITE_OTHER): Payer: Medicare HMO | Admitting: Internal Medicine

## 2018-07-21 ENCOUNTER — Other Ambulatory Visit: Payer: Medicare HMO | Admitting: *Deleted

## 2018-07-21 ENCOUNTER — Encounter: Payer: Self-pay | Admitting: Internal Medicine

## 2018-07-21 VITALS — BP 142/66 | HR 50 | Ht 64.0 in | Wt 142.0 lb

## 2018-07-21 DIAGNOSIS — I10 Essential (primary) hypertension: Secondary | ICD-10-CM

## 2018-07-21 DIAGNOSIS — E785 Hyperlipidemia, unspecified: Secondary | ICD-10-CM | POA: Diagnosis not present

## 2018-07-21 DIAGNOSIS — E782 Mixed hyperlipidemia: Secondary | ICD-10-CM | POA: Diagnosis not present

## 2018-07-21 DIAGNOSIS — I2510 Atherosclerotic heart disease of native coronary artery without angina pectoris: Secondary | ICD-10-CM

## 2018-07-21 DIAGNOSIS — I471 Supraventricular tachycardia: Secondary | ICD-10-CM

## 2018-07-21 LAB — LIPID PANEL
CHOL/HDL RATIO: 3.8 ratio (ref 0.0–4.4)
Cholesterol, Total: 143 mg/dL (ref 100–199)
HDL: 38 mg/dL — ABNORMAL LOW (ref >39)
LDL Calculated: 63 mg/dL (ref 0–99)
Triglycerides: 209 mg/dL — ABNORMAL HIGH (ref 0–149)
VLDL Cholesterol Cal: 42 mg/dL — ABNORMAL HIGH (ref 5–40)

## 2018-07-21 LAB — ALT: ALT: 17 IU/L (ref 0–32)

## 2018-07-21 NOTE — Patient Instructions (Addendum)
Medication Instructions:  Your physician recommends that you continue on your current medications as directed. Please refer to the Current Medication list given to you today.  -- If you need a refill on your cardiac medications before your next appointment, please call your pharmacy. --  Labwork:TODAY ALT LIPID  Testing/Procedures: None ordered  Follow-Up: Your physician wants you to follow-up in: 6 MONTHS with Dr. Saunders Revel in [place].   You will receive a reminder letter in the mail two months in advance. If you don't receive a letter, please call our office to schedule the follow-up appointment.  Thank you for choosing [facility]!!    Any Other Special Instructions Will Be Listed Below (If Applicable).

## 2018-07-23 ENCOUNTER — Telehealth: Payer: Self-pay

## 2018-07-23 DIAGNOSIS — M25572 Pain in left ankle and joints of left foot: Secondary | ICD-10-CM | POA: Diagnosis not present

## 2018-07-23 NOTE — Telephone Encounter (Signed)
Notes recorded by Frederik Schmidt, RN on 07/23/2018 at 8:27 AM EDT Informed patient of lab results/recommendations. She verbalized understanding and will stop Zetia for now and f/u with Lipid Clinic. ------

## 2018-07-23 NOTE — Telephone Encounter (Signed)
-----   Message from Nelva Bush, MD sent at 07/22/2018  6:54 AM EDT ----- Please let Debra Barry know that her LDL has improved significantly and is now at goal.  Triglycerides are a little high, which we will monitor.  She should continue using Praluent.  I think it is reasonable for her to stop taking ezetimibe for now.  She should f/u with the lipid clinic as previously arranged.

## 2018-07-24 DIAGNOSIS — Z23 Encounter for immunization: Secondary | ICD-10-CM | POA: Diagnosis not present

## 2018-07-29 DIAGNOSIS — H10413 Chronic giant papillary conjunctivitis, bilateral: Secondary | ICD-10-CM | POA: Diagnosis not present

## 2018-07-29 DIAGNOSIS — H43813 Vitreous degeneration, bilateral: Secondary | ICD-10-CM | POA: Diagnosis not present

## 2018-07-29 DIAGNOSIS — H2513 Age-related nuclear cataract, bilateral: Secondary | ICD-10-CM | POA: Diagnosis not present

## 2018-08-08 DIAGNOSIS — M25572 Pain in left ankle and joints of left foot: Secondary | ICD-10-CM | POA: Diagnosis not present

## 2018-08-29 DIAGNOSIS — M25572 Pain in left ankle and joints of left foot: Secondary | ICD-10-CM | POA: Diagnosis not present

## 2018-09-29 DIAGNOSIS — Z6824 Body mass index (BMI) 24.0-24.9, adult: Secondary | ICD-10-CM | POA: Diagnosis not present

## 2018-09-29 DIAGNOSIS — T148XXA Other injury of unspecified body region, initial encounter: Secondary | ICD-10-CM | POA: Diagnosis not present

## 2018-09-29 DIAGNOSIS — I2510 Atherosclerotic heart disease of native coronary artery without angina pectoris: Secondary | ICD-10-CM | POA: Diagnosis not present

## 2018-10-02 ENCOUNTER — Telehealth: Payer: Self-pay

## 2018-10-02 NOTE — Telephone Encounter (Signed)
Called pt to ask if they are still taking the praluent and they stated that they want to but the PA ran out to cover the medicine

## 2018-10-03 ENCOUNTER — Telehealth: Payer: Self-pay | Admitting: Pharmacist

## 2018-10-03 NOTE — Telephone Encounter (Signed)
Attempted to call pt to let them know PA was approved for Praluent though 03/31/19. Left message for pt to call back

## 2018-10-06 ENCOUNTER — Telehealth: Payer: Self-pay | Admitting: Internal Medicine

## 2018-10-06 NOTE — Telephone Encounter (Signed)
Spoke with patient. I let her know her PA was good through 03/31/19. Pt concerned about the short period of approval. Let her know that we will submit for another PA in June once this one expires.

## 2018-10-06 NOTE — Telephone Encounter (Signed)
Pt left a message on my voicemail Friday 10-03-18 at 4:24pm. I see that Marcelle Overlie is actually the one who called her and left a message to call her back. "Attempted to call pt to let them know PA was approved for Praluent though 03/31/19. Left message for pt to call back". Pt can be reached at [phone].

## 2018-10-28 DIAGNOSIS — L729 Follicular cyst of the skin and subcutaneous tissue, unspecified: Secondary | ICD-10-CM | POA: Diagnosis not present

## 2018-10-28 DIAGNOSIS — R21 Rash and other nonspecific skin eruption: Secondary | ICD-10-CM | POA: Diagnosis not present

## 2018-10-28 DIAGNOSIS — Z6824 Body mass index (BMI) 24.0-24.9, adult: Secondary | ICD-10-CM | POA: Diagnosis not present

## 2018-10-28 DIAGNOSIS — L989 Disorder of the skin and subcutaneous tissue, unspecified: Secondary | ICD-10-CM | POA: Diagnosis not present

## 2018-12-08 DIAGNOSIS — I10 Essential (primary) hypertension: Secondary | ICD-10-CM | POA: Diagnosis not present

## 2018-12-08 DIAGNOSIS — Z6824 Body mass index (BMI) 24.0-24.9, adult: Secondary | ICD-10-CM | POA: Diagnosis not present

## 2018-12-08 DIAGNOSIS — R7303 Prediabetes: Secondary | ICD-10-CM | POA: Diagnosis not present

## 2018-12-08 DIAGNOSIS — I2510 Atherosclerotic heart disease of native coronary artery without angina pectoris: Secondary | ICD-10-CM | POA: Diagnosis not present

## 2018-12-08 DIAGNOSIS — M810 Age-related osteoporosis without current pathological fracture: Secondary | ICD-10-CM | POA: Diagnosis not present

## 2018-12-08 DIAGNOSIS — E782 Mixed hyperlipidemia: Secondary | ICD-10-CM | POA: Diagnosis not present

## 2018-12-08 DIAGNOSIS — N183 Chronic kidney disease, stage 3 (moderate): Secondary | ICD-10-CM | POA: Diagnosis not present

## 2018-12-08 DIAGNOSIS — E890 Postprocedural hypothyroidism: Secondary | ICD-10-CM | POA: Diagnosis not present

## 2018-12-08 DIAGNOSIS — Z9181 History of falling: Secondary | ICD-10-CM | POA: Diagnosis not present

## 2018-12-15 ENCOUNTER — Telehealth: Payer: Self-pay | Admitting: *Deleted

## 2018-12-15 DIAGNOSIS — R490 Dysphonia: Secondary | ICD-10-CM | POA: Diagnosis not present

## 2018-12-15 DIAGNOSIS — Z6824 Body mass index (BMI) 24.0-24.9, adult: Secondary | ICD-10-CM | POA: Diagnosis not present

## 2018-12-15 DIAGNOSIS — M25511 Pain in right shoulder: Secondary | ICD-10-CM | POA: Diagnosis not present

## 2018-12-15 NOTE — Telephone Encounter (Signed)
Notes recorded by End, Harrell Gave, MD on 12/14/2018 at 11:36 AM EST LDL is up slightly since last check and is now 75. I recommend continuing to use Praluent and f/u per lipid clinic. LFT's normal. Mildly elevated creatinine near baseline. TSH normal.

## 2018-12-16 NOTE — Telephone Encounter (Signed)
Results called to pt. Pt verbalized understanding. Rona Ravens, PharmD has already contacted patient (see result note). Patient would like to wait to see Dr End in April.

## 2018-12-22 DIAGNOSIS — Z1231 Encounter for screening mammogram for malignant neoplasm of breast: Secondary | ICD-10-CM | POA: Diagnosis not present

## 2019-01-02 DIAGNOSIS — Z87891 Personal history of nicotine dependence: Secondary | ICD-10-CM | POA: Diagnosis not present

## 2019-01-02 DIAGNOSIS — J383 Other diseases of vocal cords: Secondary | ICD-10-CM | POA: Diagnosis not present

## 2019-01-02 DIAGNOSIS — D492 Neoplasm of unspecified behavior of bone, soft tissue, and skin: Secondary | ICD-10-CM | POA: Diagnosis not present

## 2019-01-02 DIAGNOSIS — Z9089 Acquired absence of other organs: Secondary | ICD-10-CM | POA: Diagnosis not present

## 2019-01-06 ENCOUNTER — Other Ambulatory Visit: Payer: Self-pay | Admitting: Otolaryngology

## 2019-01-06 DIAGNOSIS — D492 Neoplasm of unspecified behavior of bone, soft tissue, and skin: Secondary | ICD-10-CM

## 2019-01-06 DIAGNOSIS — R490 Dysphonia: Secondary | ICD-10-CM

## 2019-01-06 DIAGNOSIS — R221 Localized swelling, mass and lump, neck: Secondary | ICD-10-CM

## 2019-01-13 ENCOUNTER — Encounter: Payer: Self-pay | Admitting: Radiology

## 2019-01-13 ENCOUNTER — Other Ambulatory Visit: Payer: Medicare HMO

## 2019-01-13 ENCOUNTER — Ambulatory Visit
Admission: RE | Admit: 2019-01-13 | Discharge: 2019-01-13 | Disposition: A | Discharge status: Home / Self Care | Payer: Medicare HMO | Source: Ambulatory Visit | Attending: Otolaryngology | Admitting: Otolaryngology

## 2019-01-13 DIAGNOSIS — D492 Neoplasm of unspecified behavior of bone, soft tissue, and skin: Secondary | ICD-10-CM | POA: Diagnosis not present

## 2019-01-13 DIAGNOSIS — R490 Dysphonia: Secondary | ICD-10-CM | POA: Diagnosis not present

## 2019-01-13 DIAGNOSIS — R221 Localized swelling, mass and lump, neck: Secondary | ICD-10-CM

## 2019-01-13 MED ORDER — IOPAMIDOL (ISOVUE-300) INJECTION 61%
75.0000 mL | Freq: Once | INTRAVENOUS | Status: AC | PRN
  Administered 2019-01-13: 50 mL via INTRAVENOUS

## 2019-01-16 ENCOUNTER — Telehealth: Payer: Self-pay | Admitting: *Deleted

## 2019-01-16 NOTE — Telephone Encounter (Addendum)
Routing to [employer] pool.

## 2019-01-16 NOTE — Telephone Encounter (Signed)
Pt refused telephone/video visit pt states her appointment isn't urgent and wanted to cancel at this time until further noticed.  Pt has not been screened for COVID19.

## 2019-01-30 ENCOUNTER — Ambulatory Visit: Payer: Medicare HMO | Admitting: Internal Medicine

## 2019-02-24 DIAGNOSIS — N644 Mastodynia: Secondary | ICD-10-CM | POA: Diagnosis not present

## 2019-02-24 DIAGNOSIS — I10 Essential (primary) hypertension: Secondary | ICD-10-CM | POA: Diagnosis not present

## 2019-02-24 DIAGNOSIS — Z1331 Encounter for screening for depression: Secondary | ICD-10-CM | POA: Diagnosis not present

## 2019-02-24 DIAGNOSIS — Z6824 Body mass index (BMI) 24.0-24.9, adult: Secondary | ICD-10-CM | POA: Diagnosis not present

## 2019-03-10 DIAGNOSIS — Z85828 Personal history of other malignant neoplasm of skin: Secondary | ICD-10-CM | POA: Diagnosis not present

## 2019-03-10 DIAGNOSIS — Z08 Encounter for follow-up examination after completed treatment for malignant neoplasm: Secondary | ICD-10-CM | POA: Diagnosis not present

## 2019-03-10 DIAGNOSIS — L258 Unspecified contact dermatitis due to other agents: Secondary | ICD-10-CM | POA: Diagnosis not present

## 2019-03-10 DIAGNOSIS — L718 Other rosacea: Secondary | ICD-10-CM | POA: Diagnosis not present

## 2019-03-20 ENCOUNTER — Other Ambulatory Visit: Payer: Self-pay | Admitting: Internal Medicine

## 2019-03-20 ENCOUNTER — Telehealth: Payer: Self-pay

## 2019-03-20 NOTE — Telephone Encounter (Signed)
Patient is due for a follow up appointment and request a refill for Praluent 75 mg/mL  The patient thought she would need to increase the dose from her last office visit. The patient can't do a virtual visit. The patient would like to know when to come in the office to be seen and also needs to have lab work prior to the visit. Please contact patient with instructions.

## 2019-03-20 NOTE — Telephone Encounter (Signed)
°*  STAT* If patient is at the pharmacy, call can be transferred to refill team.   1. Which medications need to be refilled? (please list name of each medication and dose if known)  Praluent   2. Which pharmacy/location (including street and city if local pharmacy) is medication to be sent to?   [facility] [address] [place]   3. Do they need a 30 day or 90 day supply? [place]

## 2019-03-23 ENCOUNTER — Telehealth: Payer: Self-pay

## 2019-03-23 NOTE — Telephone Encounter (Signed)
Will fwd to Pharm-D to advise on increased Praluent dosage and follow up labs.

## 2019-03-23 NOTE — Telephone Encounter (Signed)
Message fwd to Dr. Saunders Revel to advise on when the pt is to f/u in the office   Debra Barry, RPH   9:00 AM  Note    We offered to increase Praluent dose back in Feb, but patient refused to make changes until her appointment with Dr. Saunders Revel. Please schedule patient for labs and appointment with Dr. Saunders Revel as this is what the patient is requesting. We will follow up with labs and speak with patient after labs results.  RN made aware to schedule labs

## 2019-03-23 NOTE — Telephone Encounter (Signed)
Please schedule Ms. Allerton for office visit with me at her convenience.  We will plan to obtain fasting labs at that time.  Nelva Bush, MD [facility] Pager: [phone]

## 2019-03-23 NOTE — Telephone Encounter (Signed)
We offered to increase Praluent dose back in Feb, but patient refused to make changes until her appointment with Dr. Saunders Revel. Please schedule patient for labs and appointment with Dr. Saunders Revel as this is what the patient is requesting. We will follow up with labs and speak with patient after labs results.  RN made aware to schedule labs

## 2019-03-23 NOTE — Telephone Encounter (Addendum)
Spoke with the pt. Pt sts that she does not want to schedule an appt with Dr. Saunders Revel at this time. Pt sts that she will see her pcp in August and will have lab work done with their office, she does not see the need to have labs twice. She was have her pcp provide a copy of er lab results for Dr. Saunders Revel to review. Adv her that refills for praluent may require a fastng lipid, pt sts that "she understands and she will be fine." Pt wanted to schedule an appt with Dr. Saunders Revel for Sept. Adv her that the schedule for Sept is not available as yet, she will call back closer to that time to schedule.   Adv the pt that a refill for praluent was sent to [facility] on 03/20/19. Pt verbalized understanding and will contact her pharmacy.

## 2019-04-01 ENCOUNTER — Telehealth: Payer: Self-pay

## 2019-04-01 NOTE — Telephone Encounter (Signed)
lmov to schedule appt  °

## 2019-04-01 NOTE — Telephone Encounter (Signed)
Attempted PA through Covermymeds for Praluent.  PA stated that the Preferred/Formulary drug is Repatha.

## 2019-04-01 NOTE — Telephone Encounter (Signed)
No answer. Left message to call back to schedule an appointment with Dr End to be able to discuss the Repatha vs Praluent. If she refused evisit, she may be added to a DOD in office day if necessary.

## 2019-04-01 NOTE — Telephone Encounter (Signed)
Patient states her medication was renewed and she does not wish to schedule at this time Wants to wait for September

## 2019-04-01 NOTE — Telephone Encounter (Signed)
We will discuss switching her to Repatha when I see her for follow-up.  Please have her schedule a visit with me at her earliest convenience.  Nelva Bush, MD [facility] Pager: [phone]

## 2019-04-10 DIAGNOSIS — C44311 Basal cell carcinoma of skin of nose: Secondary | ICD-10-CM | POA: Diagnosis not present

## 2019-04-16 ENCOUNTER — Other Ambulatory Visit: Payer: Self-pay | Admitting: Internal Medicine

## 2019-04-16 NOTE — Telephone Encounter (Signed)
Please advise if ok to refill Praluent 75 mg every two weeks. Pt overdue for 6 month f/u. Pt last seen 06/2018.

## 2019-04-17 NOTE — Telephone Encounter (Signed)
Pt OD for 6 month f/u pt needs to schedule f/u appointment.

## 2019-04-17 NOTE — Telephone Encounter (Signed)
Patient received another call to schedule    Now seeing Dr. Saunders Revel in August and wants to only have blood work once .  Does he want to order labs pre appt. ?   Please call

## 2019-04-17 NOTE — Telephone Encounter (Signed)
Patient does not wish to schedule at this time Wants to wait for later in the year

## 2019-04-20 DIAGNOSIS — M79672 Pain in left foot: Secondary | ICD-10-CM | POA: Diagnosis not present

## 2019-04-20 DIAGNOSIS — Z6824 Body mass index (BMI) 24.0-24.9, adult: Secondary | ICD-10-CM | POA: Diagnosis not present

## 2019-04-23 ENCOUNTER — Other Ambulatory Visit: Payer: Self-pay

## 2019-04-23 ENCOUNTER — Telehealth: Payer: Self-pay

## 2019-04-23 MED ORDER — PRALUENT 75 MG/ML ~~LOC~~ SOAJ: 2.0000 mL | SUBCUTANEOUS | 0 refills | Status: DC

## 2019-04-23 NOTE — Telephone Encounter (Signed)
Incoming call from patient.  Patient was checking the status of her Praluent refill.  Made her aware that it was sent to [place] on 04/19/2019.  That I completed a PA on this medication and the preferred drug was Repatha.  She stated that [facility] morning and they did not have her medication ready.  She was asking questions regarding Repatha and the cost.   Resent the Praluent prescription and pharmacy stated it would require a PA.

## 2019-04-23 NOTE — Telephone Encounter (Signed)
Patient calling to let you know [facility] and she is getting praluent for no cost

## 2019-04-27 DIAGNOSIS — M25572 Pain in left ankle and joints of left foot: Secondary | ICD-10-CM | POA: Diagnosis not present

## 2019-04-27 NOTE — Telephone Encounter (Signed)
Patient calling to check status of medication approval for insurance to pay  Patient ok with changing meds If needed.   Please call

## 2019-04-30 ENCOUNTER — Telehealth: Payer: Self-pay | Admitting: Internal Medicine

## 2019-04-30 MED ORDER — REPATHA SURECLICK 140 MG/ML ~~LOC~~ SOAJ: 1.0000 "pen " | SUBCUTANEOUS | 0 refills | Status: DC

## 2019-04-30 NOTE — Telephone Encounter (Signed)
Spoke with the pt. Pt sts that she has contacted her insurance company and was told that we should resubmit the second prior auth for [place]. Pt also rqst that the Repatha prescription be cancelled. Adv her not to pick up the [place] if contacted by her pharmacy and that we will resubmit the second prior auth.

## 2019-04-30 NOTE — Addendum Note (Signed)
Addended by: Lamar Laundry on: 04/30/2019 03:17 PM   Modules accepted: Orders

## 2019-04-30 NOTE — Telephone Encounter (Signed)
We can switch her to Repatha, if she likes.  However, if she wishes to discuss this in person, we will need to defer this until she is willing to be seen or have a virtual visit.  Thanks.  Nelva Bush, MD [facility] Pager: [phone]

## 2019-04-30 NOTE — Telephone Encounter (Signed)
Patient called in about pharmacy complication with her medication. At this time she wants to cancel the Repatha and continue the praluent if okayed by [facility]. Can you please further assist?

## 2019-04-30 NOTE — Telephone Encounter (Signed)
Patient wants to cancel Repatha.

## 2019-04-30 NOTE — Telephone Encounter (Addendum)
Spoke with the pt and made her aware of Dr.End's response.  Pt sts that she has done well on Praluent and was concerned that she might not have the same results with Repatha. Pt sts that she does not want to schedule an appt at this time and plans on f/u in Sept 2020. Pt sts that her out of pocket cost for Praluent would be $1000. Adv her that Praluent was denied Repatha is preferred by her insurance and should be more cost effective. Pt rqst an Rx for Repatha. Rx for Repatha sent to the pt pharmacy. Pt adv to contact the office if further assistance is needed.

## 2019-04-30 NOTE — Telephone Encounter (Signed)
Second request for PA on patients Praluent.  This will not be completed due to the first one being denied due to [place] being preferred.   Patient denied setting up an appointment with Dr. Saunders Revel.

## 2019-04-30 NOTE — Telephone Encounter (Signed)
Please review note below.

## 2019-05-01 ENCOUNTER — Telehealth: Payer: Self-pay | Admitting: Internal Medicine

## 2019-05-01 NOTE — Telephone Encounter (Signed)
PA for Praluent is fill out.  Awaiting Dr. Saunders Revel Signature.  Will Fax to [phone] when signed.

## 2019-05-01 NOTE — Telephone Encounter (Signed)
error 

## 2019-05-01 NOTE — Telephone Encounter (Signed)
Incoming call from [employer] (patients insurance agent)  She stated that the patient has already missed one dose of this medication.  I made her aware that I have completed the PA that was faxed to [place] and it is Awaiting MD signature.  Placed on Dr. Marisue Humble Desk.   She stated she needed to Expedite this.  Made her aware that Dr. Saunders Revel would not be back into the office until 05/11/2019 but I would see what could be done, if anything.

## 2019-05-04 NOTE — Telephone Encounter (Signed)
Please call regarding Praluent. States that a PA or NP can sign

## 2019-05-04 NOTE — Telephone Encounter (Signed)
PA nor NP can sign this Prior auth due to them never seeing the patient.

## 2019-05-04 NOTE — Telephone Encounter (Signed)
Please see note below concerning PA signature.

## 2019-05-05 NOTE — Telephone Encounter (Signed)
Paper PA for Praluent has been signed by Dr. Saunders Revel and Faxed to [facility].  Awaiting decision.

## 2019-05-08 DIAGNOSIS — Z85828 Personal history of other malignant neoplasm of skin: Secondary | ICD-10-CM | POA: Diagnosis not present

## 2019-05-08 DIAGNOSIS — Z08 Encounter for follow-up examination after completed treatment for malignant neoplasm: Secondary | ICD-10-CM | POA: Diagnosis not present

## 2019-05-14 MED ORDER — REPATHA SURECLICK 140 MG/ML ~~LOC~~ SOAJ: 1.0000 "pen " | SUBCUTANEOUS | 11 refills | Status: DC

## 2019-05-14 NOTE — Telephone Encounter (Signed)
Patient calling back. States she cannot get Praluent but can get Repatha through her insurance. She would like a prescription sent to her pharmacy. Confirmed with Dr End in person and he said this was fine. Rx sent to pharmacy. Patient aware and was appreciative.

## 2019-05-14 NOTE — Addendum Note (Signed)
Addended by: Vanessa Ralphs on: 05/14/2019 04:28 PM   Modules accepted: Orders

## 2019-05-14 NOTE — Telephone Encounter (Signed)
No answer on [phone]. VM was full and could not leave a message.   Left message with DPR, Aubery Lapping, to call us back.

## 2019-05-25 DIAGNOSIS — M25572 Pain in left ankle and joints of left foot: Secondary | ICD-10-CM | POA: Diagnosis not present

## 2019-05-27 ENCOUNTER — Other Ambulatory Visit: Payer: Self-pay

## 2019-05-27 ENCOUNTER — Ambulatory Visit (INDEPENDENT_AMBULATORY_CARE_PROVIDER_SITE_OTHER): Payer: Medicare HMO | Admitting: Internal Medicine

## 2019-05-27 ENCOUNTER — Telehealth: Payer: Self-pay | Admitting: Internal Medicine

## 2019-05-27 ENCOUNTER — Encounter: Payer: Self-pay | Admitting: Internal Medicine

## 2019-05-27 VITALS — BP 160/60 | HR 50 | Ht 64.0 in | Wt 145.5 lb

## 2019-05-27 DIAGNOSIS — I471 Supraventricular tachycardia: Secondary | ICD-10-CM

## 2019-05-27 DIAGNOSIS — E782 Mixed hyperlipidemia: Secondary | ICD-10-CM | POA: Diagnosis not present

## 2019-05-27 DIAGNOSIS — I2510 Atherosclerotic heart disease of native coronary artery without angina pectoris: Secondary | ICD-10-CM | POA: Diagnosis not present

## 2019-05-27 DIAGNOSIS — I38 Endocarditis, valve unspecified: Secondary | ICD-10-CM | POA: Diagnosis not present

## 2019-05-27 DIAGNOSIS — I10 Essential (primary) hypertension: Secondary | ICD-10-CM

## 2019-05-27 NOTE — Patient Instructions (Addendum)
Medication Instructions:  - Your physician recommends that you continue on your current medications as directed. Please refer to the Current Medication list given to you today.  If you need a refill on your cardiac medications before your next appointment, please call your pharmacy.   Lab work: - none ordered  If you have labs (blood work) drawn today and your tests are completely normal, you will receive your results only by: Marland Kitchen MyChart Message (if you have MyChart) OR . A paper copy in the mail If you have any lab test that is abnormal or we need to change your treatment, we will call you to review the results.  Testing/Procedures: - none ordered  Follow-Up: At [facility], you and your health needs are our priority.  As part of our continuing mission to provide you with exceptional heart care, we have created designated Provider Care Teams.  These Care Teams include your primary Cardiologist (physician) and Advanced Practice Providers (APPs -  Physician Assistants and Nurse Practitioners) who all work together to provide you with the care you need, when you need it.  You will need a follow up appointment in 6 months (February 2021) Please call our office 2 months in advance to schedule this appointment. (Call in early December to schedule).  You may see Nelva Bush, MD or one of the following Advanced Practice Providers on your designated Care Team:   Murray Hodgkins, NP Christell Faith, PA-C . Marrianne Mood, PA-C  Any Other Special Instructions Will Be Listed Below (If Applicable).  - Monitor your blood pressure at home. 1) Take your blood pressure in the morning/ evening about 30-1 hour after you take any blood pressure medications.  2) Record your readings- if you find that your blood pressure is consistently > 140/90 call the office    - We will follow up with the pharmacy by phone regarding your Repatha prior authorization

## 2019-05-27 NOTE — Telephone Encounter (Signed)
Attempted to call the patient with screening questions for her appt with Dr. Saunders Revel. She states she was driving and did not want to answer these questions now.  She did allow me to ask questions 2-4 briefly.  She is aware she will be further screened at the [place] entrance.     COVID-19 Pre-Screening Questions:  . In the past 7 to 10 days have you had a cough,  shortness of breath, headache, congestion, fever (100 or greater) body aches, chills, sore throat, or sudden loss of taste or sense of smell? . Have you been around anyone with known Covid 19. No  . Have you been around anyone who is awaiting Covid 19 test results in the past 7 to 10 days? No . Have you been around anyone who has been exposed to Covid 19, or has mentioned symptoms of Covid 19 within the past 7 to 10 days? No  If you have any concerns/questions about symptoms patients report during screening (either on the phone or at threshold). Contact the provider seeing the patient or DOD for further guidance.  If neither are available contact a member of the leadership team.

## 2019-05-27 NOTE — Progress Notes (Signed)
Follow-up Outpatient Visit Date: 05/27/2019  Primary Care Provider: Practice, [place] [id number]   Chief Complaint: Follow-up valvular heart disease, coronary artery disease, and hyperlipidemia  HPI:  Debra Barry is a 68 y.o. year-old female with history of moderate aortic and mitralregurgitation, paroxysmal atrial tachycardia, hypertension, hyperlipidemia, impaired glucose tolerance,andhypothyroidism, who presents for follow-up of valvular heart disease and hyperlipidemia.  I last saw Debra Barry in 06/2018, at which time she was doing well other than left foot pain after an injury while moving heavy furniture.  She was tolerating Praluent well, though her insurance recently requested that she switch to [place] due to formulary issues.  Today, Debra Barry reports that she has been doing well other than recurrent pain in her left foot, which she injured last year.  She has been unable to go to the gym and has therefore put on some weight.  She has short of breath when she is outside in the heat but otherwise has not had any breathing problems.  She denies chest pain, lightheadedness, orthopnea, and edema.  She has not had any recent palpitations, but notes that they have occurred in the past especially with antihistamine use.  She notes that she has had some chest congestion recently and wonders if there is an alternative agent to help with this.  Debra Barry has not been able to start [place], though we have sent in multiple prescriptions.  She states that prior authorization is needed, though we have only received prior authorization forms for Praluent, which is no longer on formulary.  She relates her blood pressure is elevated today due to stress related to coming to a new office location.  Home blood pressures are typically 125-145/50-70.  --------------------------------------------------------------------------------------------------   Cardiovascular History & Procedures: Cardiovascular Problems:  Aortic regurgitation  Mild coronary artery disease by stress test  Paroxysmal atrial tachycardia  Labile hypertension  Risk Factors:  Hypertension and age greater than 86  Cath/PCI:  None  CV Surgery:  None  EP Procedures and Devices:  48-Hour Holter Monitor(01/01/18):Predominantly sinus bradycardia with occasional PACs and atrial runs lasting up to 25 beats.  Non-Invasive Evaluation(s):  Transthoracic echocardiogram (12/04/17): Mildly dilated LV with mild LVH. LVEF 55-60% with normal wall motion. Mild AS and moderate AI. Mild MR. Upper normal PA pressure with normal RV size and function. Trivial pericardial effusion (01/01/18): Predominantly sinus bradycardia with occasional PAC's and atrial runs lasting up to 25 beats.  Pharmacologic myocardial perfusion stress test (09/11/16): Low risk study with small in size, moderate in severity, mid anterior defect that is partially reversible. LVEF 58%.  Transthoracic echocardiogram (08/27/16): Normal LV size and wall thickness. LVEF 60-65% with normal wall motion. Grade 2 diastolic dysfunction. Mild to moderate aortic regurgitation present. Mild mitral annular calcification is noted as well as mitral valve thickening. There is mild to moderate mitral regurgitation. Left atrium is moderately dilated. RV size and function are normal. There is mild pulmonary hypertension. Small circumferential pericardial effusion is present.  Renal artery Doppler (06/15/16): No evidence of renal artery stenosis. Small hyperechoic focus in the inferior right kidney may represent vascular calcification or a nonobstructive stone.  Recent CV Pertinent Labs: Lab Results  Component Value Date   CHOL 143 07/21/2018   HDL 38 (L) 07/21/2018   LDLCALC 63 07/21/2018   TRIG 209 (H) 07/21/2018   CHOLHDL 3.8 07/21/2018   CHOLHDL 3.6 09/27/2016   K 3.8 09/27/2016   BUN 43 (H) 09/27/2016  CREATININE 1.91 (H) 09/27/2016    Past medical and surgical history were reviewed and updated in EPIC.  Current Meds  Medication Sig  . alendronate (FOSAMAX) 70 MG tablet Take 70 mg by mouth once a week. Take with a full glass of water on an empty stomach.  Marland Kitchen amLODipine (NORVASC) 10 MG tablet Take 10 mg by mouth daily.  Marland Kitchen aspirin EC 81 MG tablet Take 81 mg by mouth daily.  . cyclobenzaprine (FLEXERIL) 10 MG tablet Take 10 mg by mouth daily as needed.  . hydrochlorothiazide (MICROZIDE) 12.5 MG capsule 1 capsule (12.5 mg total) daily. Pt reports currently taking this medication  . levothyroxine (SYNTHROID, LEVOTHROID) 75 MCG tablet   . multivitamin-lutein (OCUVITE-LUTEIN) CAPS capsule Take 1 capsule by mouth daily.  . Omega-3 Fatty Acids (FISH OIL) 1000 MG CPDR Take 2 capsules by mouth 2 (two) times daily.  . Polyvinyl Alcohol-Povidone (REFRESH OP) Apply 1 drop to eye 4 (four) times daily as needed (dry eyes).   Marland Kitchen telmisartan (MICARDIS) 80 MG tablet Take 80 mg by mouth daily.  . Tetrahydrozoline HCl (EYE DROPS OP) Place 1 drop into both eyes at bedtime as needed. Refresh eye gel    Allergies: Citric acid, Codeine, Penicillins, Statins, Tricor [fenofibrate], Verapamil, and Sulfa antibiotics  Social History   Tobacco Use  . Smoking status: Former Smoker    Packs/day: 0.25    Types: Cigarettes    Quit date: 10/22/1974    Years since quitting: 44.6  . Smokeless tobacco: Never Used  Substance Use Topics  . Alcohol use: No    Alcohol/week: 0.0 standard drinks  . Drug use: No    Family History  Problem Relation Age of Onset  . Rectal cancer Father   . Hepatitis Father   . Hypertension Mother   . Hyperlipidemia Mother   . Glaucoma Mother   . Stroke Mother        TIA  . Heart disease Maternal Grandfather   . Colon cancer Neg Hx     Review of Systems: A 12-system review of systems was performed and was negative except as noted in the HPI.   --------------------------------------------------------------------------------------------------  Physical Exam: BP (!) 160/60 (BP Location: Left Arm, Debra Barry Position: Sitting, Cuff Size: Normal)   Pulse (!) 50   Ht 5\' 4"  (1.626 m)   Wt 145 lb 8 oz (66 kg)   LMP  (LMP Unknown)   BMI 24.98 kg/m   Repeat BP 158/62  General: NAD. HEENT: No conjunctival pallor or scleral icterus.  Facemask in place. Neck: Supple without lymphadenopathy, thyromegaly, JVD, or HJR. Lungs: Normal work of breathing. Clear to auscultation bilaterally without wheezes or crackles. Heart: Bradycardic but regular with 2/6 systolic murmur.  No rubs or gallops. Abd: Bowel sounds present. Soft, NT/ND without hepatosplenomegaly Ext: No lower extremity edema. Radial, PT, and DP pulses are 2+ bilaterally. Skin: Warm and dry without rash.  EKG: Sinus bradycardia (heart rate 50 bpm) with left axis deviation.  PACs noted on prior tracing are no longer present.  Lab Results  Component Value Date   WBC 3.2 (L) 09/27/2016   HGB 10.2 (L) 09/27/2016   HCT 31.6 (L) 09/27/2016   MCV 94.0 09/27/2016   PLT 183 09/27/2016    Lab Results  Component Value Date   NA 138 09/27/2016   K 3.8 09/27/2016   CL 105 09/27/2016   CO2 24 09/27/2016   BUN 43 (H) 09/27/2016   CREATININE 1.91 (H) 09/27/2016   GLUCOSE 89 09/27/2016  ALT 17 07/21/2018    Lab Results  Component Value Date   CHOL 143 07/21/2018   HDL 38 (L) 07/21/2018   LDLCALC 63 07/21/2018   TRIG 209 (H) 07/21/2018   CHOLHDL 3.8 07/21/2018    --------------------------------------------------------------------------------------------------  ASSESSMENT AND PLAN: Coronary artery disease: No chest pain or worsening dyspnea reported to suggest worsening coronary insufficiency.  We will continue with medical therapy, including working to get Debra Barry back on a PCSK9 inhibitor.  Paroxysmal atrial tachycardia: No significant palpitations reported.  We will  defer adding non-dihydropyridine CCB or BB in the setting of resting bradycardia.  Valvular heart disease: Debra Barry has history of mitral and aortic regurgitation but appears euvolemic and well compensated with NYHA class II symptoms.  We will plan to obtain a transthoracic echocardiogram shortly before Debra Barry follows up with me in 6 months to reassess her valvular disease.  Hyperlipidemia: LDL goal less than 70, given history of CAD.  Debra Barry has been intolerant of multiple statins but was doing well with Praluent.  Fortunately, this is no longer on formulary.  We will reach out to her pharmacy to facilitate approval of [place].  Debra Barry will need a lipid panel and ALT in 6 to 8 weeks, which can be done through her PCP.  Hypertension: Blood pressure moderately elevated today, which Debra Barry attributes to stress.  Home readings are typically better.  Blood pressure readings between our office and her home cuff correlates today.  I advised her to contact us if her blood pressures consistently above 140/90.  We will defer medication changes at this time.  Follow-up: Return to clinic in 6 months.  Nelva Bush, MD 05/27/2019 1:37 PM

## 2019-05-28 ENCOUNTER — Encounter: Payer: Self-pay | Admitting: Internal Medicine

## 2019-05-29 ENCOUNTER — Telehealth: Payer: Self-pay | Admitting: Internal Medicine

## 2019-05-29 DIAGNOSIS — I350 Nonrheumatic aortic (valve) stenosis: Secondary | ICD-10-CM

## 2019-05-29 DIAGNOSIS — I38 Endocarditis, valve unspecified: Secondary | ICD-10-CM

## 2019-05-29 NOTE — Telephone Encounter (Signed)
End, Harrell Gave, MD  P Cv Div [place] Triage        Hello,   Could you reach out to Debra Barry and let her know that I recommend using guaifenesin for chest congestion/cough (we discussed this during the visit but I neglected to include the name on the AVS). Also, could you set her up for an echo to be done at the time of her visit with me in ~6 months to reassess valvular heart disease? Thanks.   Gerald Stabs

## 2019-06-01 ENCOUNTER — Other Ambulatory Visit: Payer: Self-pay | Admitting: *Deleted

## 2019-06-01 ENCOUNTER — Telehealth: Payer: Self-pay | Admitting: *Deleted

## 2019-06-01 MED ORDER — REPATHA SURECLICK 140 MG/ML ~~LOC~~ SOAJ: 1.0000 "pen " | SUBCUTANEOUS | 11 refills | Status: DC

## 2019-06-01 NOTE — Telephone Encounter (Signed)
Called patient and she verbalized understanding of the guaifenesin and wrote the name down. She is also aware to call back in 6 months to schedule echo and f/u appointment.  She as about the Repatha as well. Her pharmacy said that they are waiting on an ok from the physician. She expressed frustration that it has been 2 months and she has not gotten her medication yet. I apologized and will call the pharmacy now.

## 2019-06-01 NOTE — Telephone Encounter (Signed)
Pharmacy is closed at this time and will open at 9 am.  Will call later.

## 2019-06-01 NOTE — Telephone Encounter (Signed)
[place]. Pharmacist says they are requesting a prior authorization. She is faxing the request to [place] now. Routing to refill pool as I am working from home today to see if they receive the request.

## 2019-06-01 NOTE — Telephone Encounter (Signed)
Pt requiring PA for Rep[employer] 140 inj. Pt has been approved for Repatha 140 inj. PA Case: [id number], Status: Approved, Coverage Starts on: 10/22/2018 12:00:00 AM, Coverage Ends on: 10/22/2019 12:00:00 AM. Questions? Contact [phone].

## 2019-06-02 NOTE — Telephone Encounter (Signed)
[place] and states patient picked up [place] today.  Called patient to let her know I had followed up on it and she said she had it. She expressed her appreciation.

## 2019-06-15 DIAGNOSIS — E890 Postprocedural hypothyroidism: Secondary | ICD-10-CM | POA: Diagnosis not present

## 2019-06-15 DIAGNOSIS — Z1331 Encounter for screening for depression: Secondary | ICD-10-CM | POA: Diagnosis not present

## 2019-06-15 DIAGNOSIS — I2510 Atherosclerotic heart disease of native coronary artery without angina pectoris: Secondary | ICD-10-CM | POA: Diagnosis not present

## 2019-06-15 DIAGNOSIS — N183 Chronic kidney disease, stage 3 (moderate): Secondary | ICD-10-CM | POA: Diagnosis not present

## 2019-06-15 DIAGNOSIS — E782 Mixed hyperlipidemia: Secondary | ICD-10-CM | POA: Diagnosis not present

## 2019-06-15 DIAGNOSIS — Z9181 History of falling: Secondary | ICD-10-CM | POA: Diagnosis not present

## 2019-06-15 DIAGNOSIS — Z0000 Encounter for general adult medical examination without abnormal findings: Secondary | ICD-10-CM | POA: Diagnosis not present

## 2019-06-15 DIAGNOSIS — R7303 Prediabetes: Secondary | ICD-10-CM | POA: Diagnosis not present

## 2019-06-15 DIAGNOSIS — E785 Hyperlipidemia, unspecified: Secondary | ICD-10-CM | POA: Diagnosis not present

## 2019-06-15 DIAGNOSIS — I10 Essential (primary) hypertension: Secondary | ICD-10-CM | POA: Diagnosis not present

## 2019-06-24 DIAGNOSIS — M25572 Pain in left ankle and joints of left foot: Secondary | ICD-10-CM | POA: Diagnosis not present

## 2019-07-22 DIAGNOSIS — L258 Unspecified contact dermatitis due to other agents: Secondary | ICD-10-CM | POA: Diagnosis not present

## 2019-07-22 DIAGNOSIS — L720 Epidermal cyst: Secondary | ICD-10-CM | POA: Diagnosis not present

## 2019-07-27 DIAGNOSIS — M545 Low back pain: Secondary | ICD-10-CM | POA: Diagnosis not present

## 2019-07-27 DIAGNOSIS — I10 Essential (primary) hypertension: Secondary | ICD-10-CM | POA: Diagnosis not present

## 2019-07-27 DIAGNOSIS — F329 Major depressive disorder, single episode, unspecified: Secondary | ICD-10-CM | POA: Diagnosis not present

## 2019-07-27 DIAGNOSIS — Z23 Encounter for immunization: Secondary | ICD-10-CM | POA: Diagnosis not present

## 2019-07-27 DIAGNOSIS — Z6824 Body mass index (BMI) 24.0-24.9, adult: Secondary | ICD-10-CM | POA: Diagnosis not present

## 2019-08-03 DIAGNOSIS — H43813 Vitreous degeneration, bilateral: Secondary | ICD-10-CM | POA: Diagnosis not present

## 2019-08-03 DIAGNOSIS — H10413 Chronic giant papillary conjunctivitis, bilateral: Secondary | ICD-10-CM | POA: Diagnosis not present

## 2019-08-03 DIAGNOSIS — H2513 Age-related nuclear cataract, bilateral: Secondary | ICD-10-CM | POA: Diagnosis not present

## 2019-08-12 DIAGNOSIS — F329 Major depressive disorder, single episode, unspecified: Secondary | ICD-10-CM | POA: Diagnosis not present

## 2019-08-31 DIAGNOSIS — F329 Major depressive disorder, single episode, unspecified: Secondary | ICD-10-CM | POA: Diagnosis not present

## 2019-11-02 ENCOUNTER — Other Ambulatory Visit: Payer: Self-pay

## 2019-11-02 ENCOUNTER — Ambulatory Visit: Payer: Medicare HMO | Attending: Physician Assistant | Admitting: Physical Therapy

## 2019-11-02 ENCOUNTER — Encounter: Payer: Self-pay | Admitting: Physical Therapy

## 2019-11-02 DIAGNOSIS — G8929 Other chronic pain: Secondary | ICD-10-CM | POA: Diagnosis not present

## 2019-11-02 DIAGNOSIS — M545 Low back pain, unspecified: Secondary | ICD-10-CM

## 2019-11-02 DIAGNOSIS — M6281 Muscle weakness (generalized): Secondary | ICD-10-CM | POA: Diagnosis not present

## 2019-11-02 NOTE — Therapy (Signed)
[place], [place], [address] Phone: [phone]   Fax:  [phone]  Physical Therapy Evaluation  Patient Details  Name: Debra Barry MRN: [id number] Date of Birth: 1951-05-31 Referring Provider (PT): Cyndi Bender, [place]   Encounter Date: 11/02/2019  PT End of Session - 11/02/19 0826    Visit Number  1    Number of Visits  6    Date for PT Re-Evaluation  12/14/19    Authorization Type  [employer] MCR    PT Start Time  0830    PT Stop Time  0915    PT Time Calculation (min)  45 min    Activity Tolerance  Patient tolerated treatment well    Behavior During Therapy  [facility] for tasks assessed/performed       Past Medical History:  Diagnosis Date  . Arthritis   . Complication of anesthesia   . Heart murmur   . Hx of adenomatous polyp of colon 08/11/2015  . Hyperlipidemia   . Hypertension   . Hypothyroidism   . Impaired glucose tolerance   . Patella fracture right    2014  . Plantar fascial fibromatosis    left foot  . PONV (postoperative nausea and vomiting)     Past Surgical History:  Procedure Laterality Date  . APPENDECTOMY  1981  . COLON RESECTION  1985   benign tumor  . FOOT SURGERY Left 2003  . patella fracture repair Left 2007  . PLANTAR FASCIA RELEASE Left 02/09/2016   Procedure: LEFT FOOT ENDOSCOPIC PLANTAR FASCIOTOMY;  Surgeon: [name], MD;  Location: [facility];  Service: Orthopedics;  Laterality: Left;  . THYROIDECTOMY  1995  . VAGINAL HYSTERECTOMY  1983    There were no vitals filed for this visit.   Subjective Assessment - 11/02/19 0830    Subjective  Patient reports she has been dealing with sciatica for the past 5 years on and off. The pain used to be on the right but is currently affecting her left side. The pain typically comes on when she is sedentary. She is not able to exercise as much due to COVID so her pain has been flaring up. She feels walking  and moving around helps and she has been doing her own stretching.    Limitations  Sitting;Other (comment)   lying down   How long can you sit comfortably?  1 hour    How long can you stand comfortably?  No limitation    How long can you walk comfortably?  No limitation    Patient Stated Goals  Reduce sciatica flare ups so she can return to prior level of function with no limitations.    Currently in Pain?  No/denies    Pain Score  0-No pain    Pain Location  Hip    Pain Orientation  Left    Pain Descriptors / Indicators  Aching;Radiating;Shooting    Pain Type  Chronic pain    Pain Radiating Towards  posterior thigh    Pain Onset  More than a month ago    Pain Frequency  Intermittent    Aggravating Factors   Sitting and lying down for too long    Pain Relieving Factors  Salon pas, heat, walking, changing positions Tylenol    Effect of Pain on Daily Activities  Patient is limited in how long she can sit or lie down before she has to get up  [facility] PT Assessment - 11/02/19 0001      Assessment   Medical Diagnosis  Low back pain, left sided sciatica    Referring Provider (PT)  Cyndi Bender, PA-C    Onset Date/Surgical Date  --   patient reports ~5 years   Hand Dominance  Right    Next MD Visit  Not scheduled    Prior Therapy  Yes - ankle/foot      Precautions   Precautions  None      Restrictions   Weight Bearing Restrictions  No      Balance Screen   Has the patient fallen in the past 6 months  No    Has the patient had a decrease in activity level because of a fear of falling?   No    Is the patient reluctant to leave their home because of a fear of falling?   No      Home Film/video editor residence      Prior Function   Level of Independence  Independent    Vocation  --    Leisure  Walking, exercise      Cognition   Overall Cognitive Status  Within Functional Limits for tasks assessed      Observation/Other Assessments    Observations  Patient appears in no apparent distress    Focus on Therapeutic Outcomes (FOTO)   35% limitation      Sensation   Light Touch  Appears Intact      Posture/Postural Control   Posture/Postural Control  No significant limitations      ROM / Strength   AROM / PROM / Strength  AROM;PROM;Strength      AROM   Overall AROM Comments  All lumbar AROM WFL and pain free    AROM Assessment Site  Lumbar    Lumbar Flexion  --      PROM   Overall PROM Comments  All hip PROM WFL and pain free      Strength   Strength Assessment Site  Hip;Knee    Right/Left Hip  Right;Left    Right Hip Flexion  4/5    Right Hip Extension  4-/5    Right Hip ABduction  4-/5    Left Hip Flexion  4/5    Left Hip Extension  4-/5    Left Hip ABduction  4-/5    Right/Left Knee  Right;Left    Right Knee Flexion  4+/5    Right Knee Extension  4+/5    Left Knee Flexion  4+/5    Left Knee Extension  4+/5      Flexibility   Soft Tissue Assessment /Muscle Length  yes    Hamstrings  WFL    Piriformis  Limited greater on left - patient reports deep hip pulling      Palpation   Spinal mobility  WFL and non painful CPA of lumbar region    Palpation comment  Mild TTP over left gluteal region      Special Tests   Other special tests  Radicular testing negative this visit (SLR, slump), no pain produced with repeated motions      Transfers   Transfers  Independent with all Transfers      Ambulation/Gait   Ambulation/Gait  Yes    Ambulation/Gait Assistance  7: Independent    Gait Pattern  Within Functional Limits  Objective measurements completed on examination: See above findings.      [facility] Adult PT Treatment/Exercise - 11/02/19 0001      Exercises   Exercises  Lumbar      Lumbar Exercises: Stretches   Lower Trunk Rotation Limitations  x10    Piriformis Stretch  3 reps;20 seconds    Figure 4 Stretch  3 reps;20 seconds      Lumbar Exercises: Supine   Bridge  10  reps    Bridge Limitations  yellow band, VC for technique and glut/abdominal activation    Straight Leg Raise  10 reps    Straight Leg Raises Limitations  VC for PPT and core stabilization      Lumbar Exercises: Sidelying   Clam  10 reps             PT Education - 11/02/19 0826    Education Details  Exam findings, POC, HEP, continued walking and activity    Person(s) Educated  Patient    Methods  Explanation;Demonstration;Verbal cues;Handout    Comprehension  Verbalized understanding;Returned demonstration;Verbal cues required;Need further instruction       PT Short Term Goals - 11/02/19 1253      PT SHORT TERM GOAL #1   Title  Patient will be independent with initial HEP to progress in PT.    Time  4    Period  Weeks    Status  New    Target Date  11/30/19      PT SHORT TERM GOAL #2   Title  Patient will be able to sit > 1 hour with </= 1-2/10 pain level to indicate improved sitting tolerance.    Time  4    Period  Weeks    Status  New    Target Date  11/30/19        PT Long Term Goals - 11/02/19 1255      PT LONG TERM GOAL #1   Title  FOTO score will improve from 35%  limited to 26% limited  to demo improved function and mobility.    Time  6    Period  Weeks    Status  New    Target Date  12/14/19      PT LONG TERM GOAL #2   Title  Patient will be able to sit and lie down for as long as she wants without increased pain level.    Time  6    Period  Weeks    Status  New    Target Date  12/14/19      PT LONG TERM GOAL #3   Title  Patient will exhibit improved left hip strength of >/= 4+/5 MMT grossly to allow for improve activity tolerance without pain.    Time  6    Period  Weeks    Status  New    Target Date  12/14/19             Plan - 11/02/19 [id number]    Clinical Impression Statement  Patient presents to PT with report of chronic sciatica and low back pain when she is sitting or lying down for extended periods. The current pain originates in  the left hip and will radiate down the back of the right thigh. She exhibits good lumbar and hip ROM, but seems to have some muscular tightness of the hip and strength deficit of the core and gluteals. She would benefit from continued skilled PT to progress her flexibility and  strength to reduce onset of pain and improve functional ability.    Personal Factors and Comorbidities  Time since onset of injury/illness/exacerbation    Examination-Activity Limitations  Sit;Other   lying down   Examination-Participation Restrictions  Community Activity;Cleaning    Stability/Clinical Decision Making  Stable/Uncomplicated    Clinical Decision Making  Low    Rehab Potential  Good    PT Frequency  1x / week    PT Duration  8 weeks    PT Treatment/Interventions  Cryotherapy;Electrical Stimulation;Moist Heat;Neuromuscular re-education;Therapeutic exercise;Therapeutic activities;Patient/family education;Manual techniques;Dry needling;Passive range of motion;Taping;Spinal Manipulations;Joint Manipulations;Gait training;Stair training    PT Next Visit Plan  Assess HEP and progress PRN, hip stretching PRN, progress core and gluteal strength    PT Home Exercise Plan  Supine piriformis and figure-4 stretch, SLR, clamshell with yellow, bridge with yellow    Consulted and Agree with Plan of Care  Patient       Patient will benefit from skilled therapeutic intervention in order to improve the following deficits and impairments:  Impaired flexibility, Decreased strength, Pain, Decreased endurance, Decreased activity tolerance  Visit Diagnosis: Chronic bilateral low back pain, unspecified whether sciatica present  Muscle weakness (generalized)     Problem List Patient Active Problem List   Diagnosis Date Noted  . Mild mitral and aortic regurgitation 01/27/2018  . Aortic valve stenosis 01/27/2018  . Sinus bradycardia 01/27/2018  . Paroxysmal atrial tachycardia ([place]) 01/27/2018  . Mixed hyperlipidemia  01/27/2018  . Coronary artery disease involving native coronary artery of native heart without angina pectoris 02/25/2017  . Valvular heart disease 02/25/2017  . Essential hypertension 02/25/2017  . Hypertensive heart disease 09/26/2016  . Hx of adenomatous polyp of colon 08/11/2015  . Family history of colon cancer - father + sister both in 28's 08/11/2015    Hilda Blades, PT, DPT, LAT, ATC 11/02/19  1:09 PM Phone: [phone] Fax: [facility] [address] [place], [place], [address] Phone: [phone]   Fax:  [phone]  Name: Debra Barry MRN: [id number] Date of Birth: 09/02/1951

## 2019-11-02 NOTE — Patient Instructions (Signed)
Access Code: [email or web address]  URL: [email or web address]  Date: 11/02/2019  Prepared by: Hilda Blades   Exercises Supine Piriformis Stretch with Foot on Ground - 3 reps - 20-30 seconds hold Supine Figure 4 Piriformis Stretch - 3 reps - 20-30 seconds hold Supine Lower Trunk Rotation - 10 reps - 2 sets Supine Pelvic Tilt with Straight Leg Raise - 10 reps - 2 sets Clamshell with Resistance - 10 reps - 2 sets Supine Bridge with Resistance Band - 10 reps - 2 sets - 1x daily - 7x weekly

## 2019-11-11 DIAGNOSIS — I10 Essential (primary) hypertension: Secondary | ICD-10-CM | POA: Diagnosis not present

## 2019-11-11 DIAGNOSIS — F329 Major depressive disorder, single episode, unspecified: Secondary | ICD-10-CM | POA: Diagnosis not present

## 2019-11-11 DIAGNOSIS — M5430 Sciatica, unspecified side: Secondary | ICD-10-CM | POA: Diagnosis not present

## 2019-11-11 DIAGNOSIS — M7702 Medial epicondylitis, left elbow: Secondary | ICD-10-CM | POA: Diagnosis not present

## 2019-11-11 DIAGNOSIS — Z6824 Body mass index (BMI) 24.0-24.9, adult: Secondary | ICD-10-CM | POA: Diagnosis not present

## 2019-11-12 ENCOUNTER — Ambulatory Visit: Payer: Medicare HMO | Admitting: Physical Therapy

## 2019-11-19 ENCOUNTER — Ambulatory Visit: Payer: Medicare HMO | Admitting: Physical Therapy

## 2019-11-25 ENCOUNTER — Other Ambulatory Visit: Payer: Self-pay

## 2019-11-25 ENCOUNTER — Encounter: Payer: Self-pay | Admitting: Physical Therapy

## 2019-11-25 ENCOUNTER — Ambulatory Visit: Payer: Medicare HMO | Attending: Physician Assistant | Admitting: Physical Therapy

## 2019-11-25 DIAGNOSIS — G8929 Other chronic pain: Secondary | ICD-10-CM | POA: Insufficient documentation

## 2019-11-25 DIAGNOSIS — M6281 Muscle weakness (generalized): Secondary | ICD-10-CM | POA: Insufficient documentation

## 2019-11-25 DIAGNOSIS — M545 Low back pain, unspecified: Secondary | ICD-10-CM

## 2019-11-25 NOTE — Therapy (Signed)
[place], [place], [address] Phone: [phone]   Fax:  [phone]  Physical Therapy Treatment  Patient Details  Name: Debra Barry MRN: [id number] Date of Birth: May 03, 1951 Referring Provider (PT): Cyndi Bender, [place]   Encounter Date: 11/25/2019  PT End of Session - 11/25/19 0919    Visit Number  2    Number of Visits  6    Date for PT Re-Evaluation  12/14/19    Authorization Type  [employer] MCR    PT Start Time  0918    PT Stop Time  0958    PT Time Calculation (min)  40 min    Activity Tolerance  Patient tolerated treatment well    Behavior During Therapy  [facility] for tasks assessed/performed       Past Medical History:  Diagnosis Date  . Arthritis   . Complication of anesthesia   . Heart murmur   . Hx of adenomatous polyp of colon 08/11/2015  . Hyperlipidemia   . Hypertension   . Hypothyroidism   . Impaired glucose tolerance   . Patella fracture right    2014  . Plantar fascial fibromatosis    left foot  . PONV (postoperative nausea and vomiting)     Past Surgical History:  Procedure Laterality Date  . APPENDECTOMY  1981  . COLON RESECTION  1985   benign tumor  . FOOT SURGERY Left 2003  . patella fracture repair Left 2007  . PLANTAR FASCIA RELEASE Left 02/09/2016   Procedure: LEFT FOOT ENDOSCOPIC PLANTAR FASCIOTOMY;  Surgeon: [name], MD;  Location: [facility];  Service: Orthopedics;  Laterality: Left;  . THYROIDECTOMY  1995  . VAGINAL HYSTERECTOMY  1983    There were no vitals filed for this visit.  Subjective Assessment - 11/25/19 0919    Subjective  Patient reports her sciatica symptoms are doing much better. She did pull a tendon in right left elbow that forced her to miss the last 2 appointments.    Patient Stated Goals  Reduce sciatica flare ups so she can return to prior level of function with no limitations.    Currently in Pain?  No/denies                        [facility] Adult PT Treatment/Exercise - 11/25/19 0001      Exercises   Exercises  Lumbar      Lumbar Exercises: Stretches   Single Knee to Chest Stretch  2 reps;20 seconds    Lower Trunk Rotation Limitations  x20    Piriformis Stretch  3 reps;20 seconds    Piriformis Stretch Limitations  supine    Figure 4 Stretch  --      Lumbar Exercises: Aerobic   Nustep  L4 x6 min UE/LE      Lumbar Exercises: Supine   Bridge  10 reps   2 sets   Bridge Limitations  feet together, knees pushed apart    Straight Leg Raise  10 reps   2 sets     Lumbar Exercises: Sidelying   Clam  15 reps   2 sets            PT Education - 11/25/19 0919    Education Details  HEP    Person(s) Educated  Patient    Methods  Explanation;Demonstration;Verbal cues    Comprehension  Verbalized understanding;Returned demonstration;Verbal cues required;Need further instruction  PT Short Term Goals - 11/25/19 0955      PT SHORT TERM GOAL #1   Title  Patient will be independent with initial HEP to progress in PT.    Time  4    Period  Weeks    Status  On-going    Target Date  11/30/19      PT SHORT TERM GOAL #2   Title  Patient will be able to sit > 1 hour with </= 1-2/10 pain level to indicate improved sitting tolerance.    Time  4    Period  Weeks    Status  Achieved        PT Long Term Goals - 11/02/19 1255      PT LONG TERM GOAL #1   Title  FOTO score will improve from 35%  limited to 26% limited  to demo improved function and mobility.    Time  6    Period  Weeks    Status  New    Target Date  12/14/19      PT LONG TERM GOAL #2   Title  Patient will be able to sit and lie down for as long as she wants without increased pain level.    Time  6    Period  Weeks    Status  New    Target Date  12/14/19      PT LONG TERM GOAL #3   Title  Patient will exhibit improved left hip strength of >/= 4+/5 MMT grossly to allow for improve activity tolerance  without pain.    Time  6    Period  Weeks    Status  New    Target Date  12/14/19            Plan - 11/25/19 [id number]    Clinical Impression Statement  Patient is doing well in regard to her lower back, hip, and sciatica symptoms. She missed the past 2 sessions due to aggravating her elbow. This visit we reviewed her exercises to ensure proper form and to avoid any other exacerbation of different joints. Due to her improvement she will schedule 1 more visit in 2 weeks. She would benefit from continued skilled PT to improve her ability to sit for extended periods.    PT Treatment/Interventions  Cryotherapy;Electrical Stimulation;Moist Heat;Neuromuscular re-education;Therapeutic exercise;Therapeutic activities;Patient/family education;Manual techniques;Dry needling;Passive range of motion;Taping;Spinal Manipulations;Joint Manipulations;Gait training;Stair training    PT Next Visit Plan  Assess HEP and progress PRN, hip stretching PRN, progress core and gluteal strength    PT Home Exercise Plan  Supine piriformis and figure-4 stretch, SLR, clamshell with yellow, bridge with yellow    Consulted and Agree with Plan of Care  Patient       Patient will benefit from skilled therapeutic intervention in order to improve the following deficits and impairments:  Impaired flexibility, Decreased strength, Pain, Decreased endurance, Decreased activity tolerance  Visit Diagnosis: Chronic bilateral low back pain, unspecified whether sciatica present  Muscle weakness (generalized)     Problem List Patient Active Problem List   Diagnosis Date Noted  . Mild mitral and aortic regurgitation 01/27/2018  . Aortic valve stenosis 01/27/2018  . Sinus bradycardia 01/27/2018  . Paroxysmal atrial tachycardia ([place]) 01/27/2018  . Mixed hyperlipidemia 01/27/2018  . Coronary artery disease involving native coronary artery of native heart without angina pectoris 02/25/2017  . Valvular heart disease 02/25/2017  .  Essential hypertension 02/25/2017  . Hypertensive heart disease 09/26/2016  . Hx of  adenomatous polyp of colon 08/11/2015  . Family history of colon cancer - father + sister both in 75's 08/11/2015    Hilda Blades, PT, DPT, LAT, ATC 11/25/19  10:00 AM Phone: [phone] Fax: [facility] [address] [place], [place], [address] Phone: [phone]   Fax:  [phone]  Name: Debra Barry MRN: [id number] Date of Birth: 04-Oct-1951

## 2019-11-30 IMAGING — DX DG ANKLE COMPLETE 3+V*L*
3 series · 3 of 3 positions shown · non-contrast
Comparison: Ankle MRI 10/18/2015.

CLINICAL DATA: Lateral ankle and foot pain after feeling a pop when
bending over yesterday.

EXAM:
LEFT ANKLE COMPLETE - 3+ VIEW

[ankle ap]
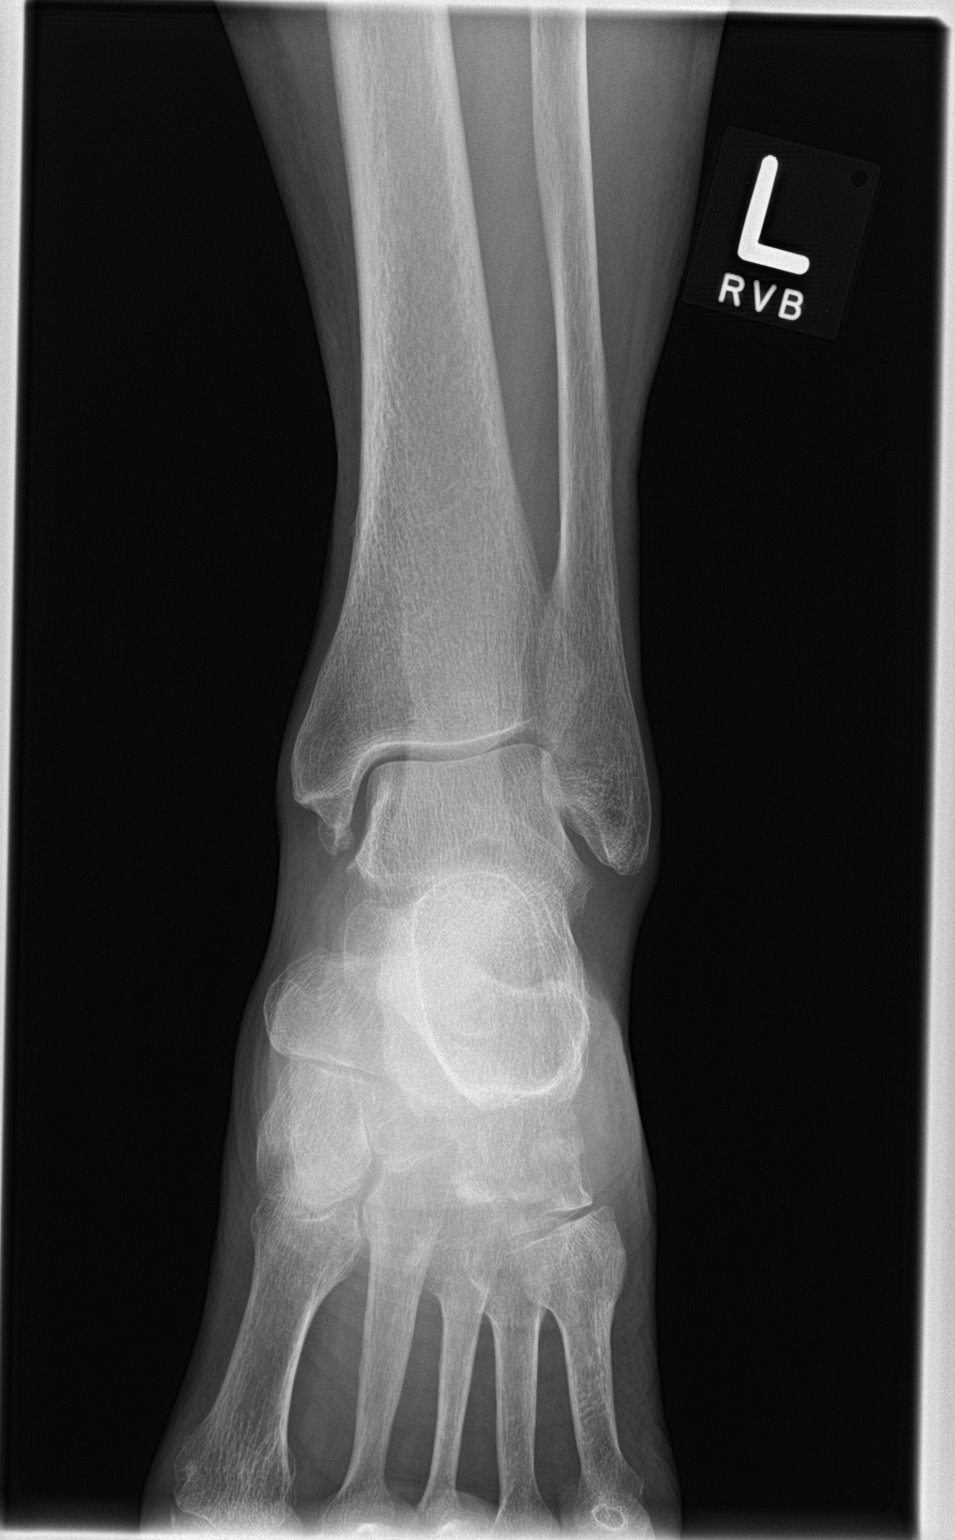

[ankle obl]
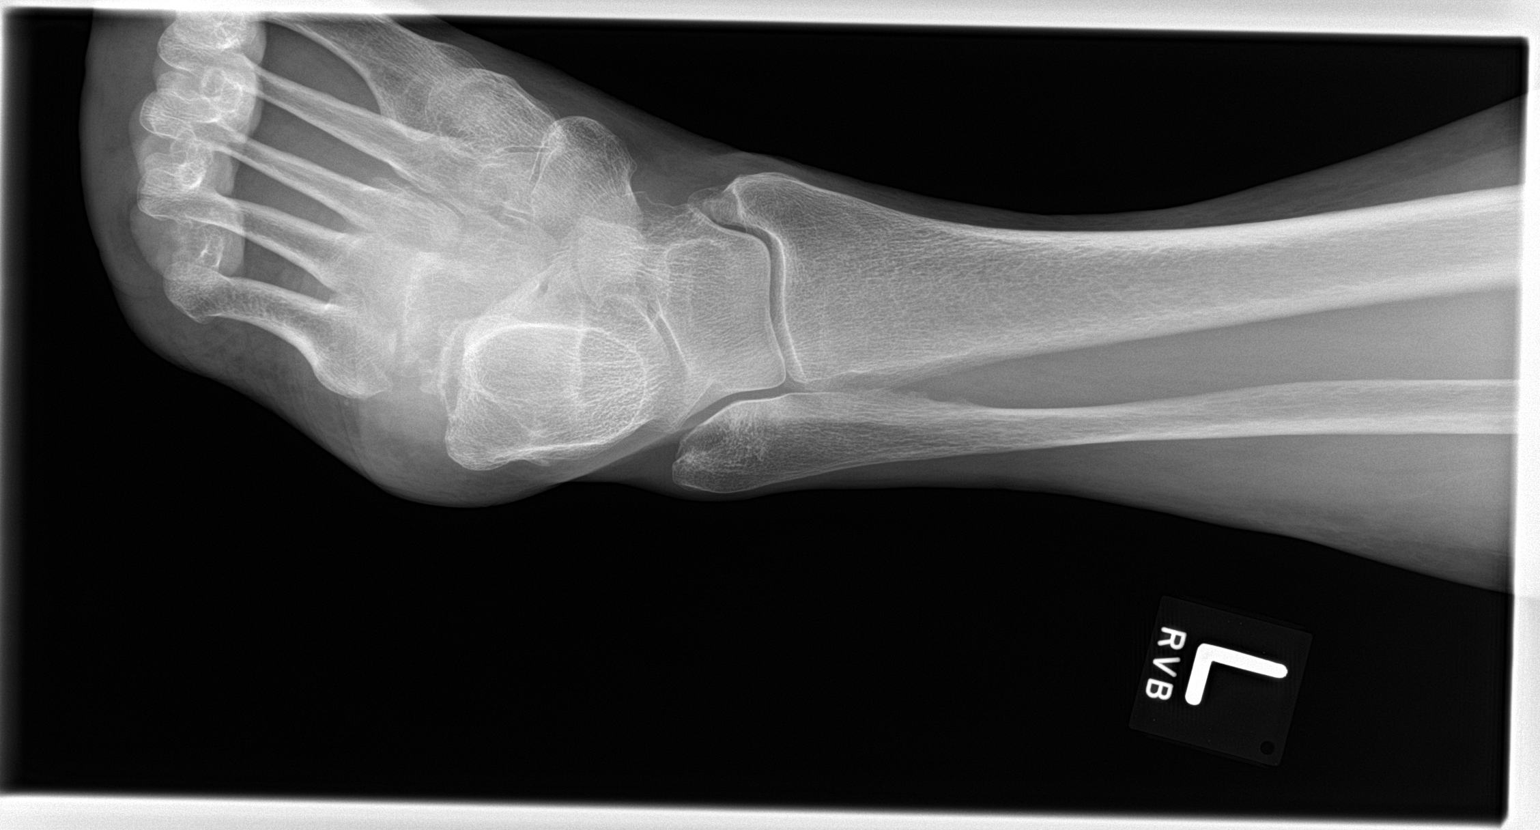

[ankle lat]
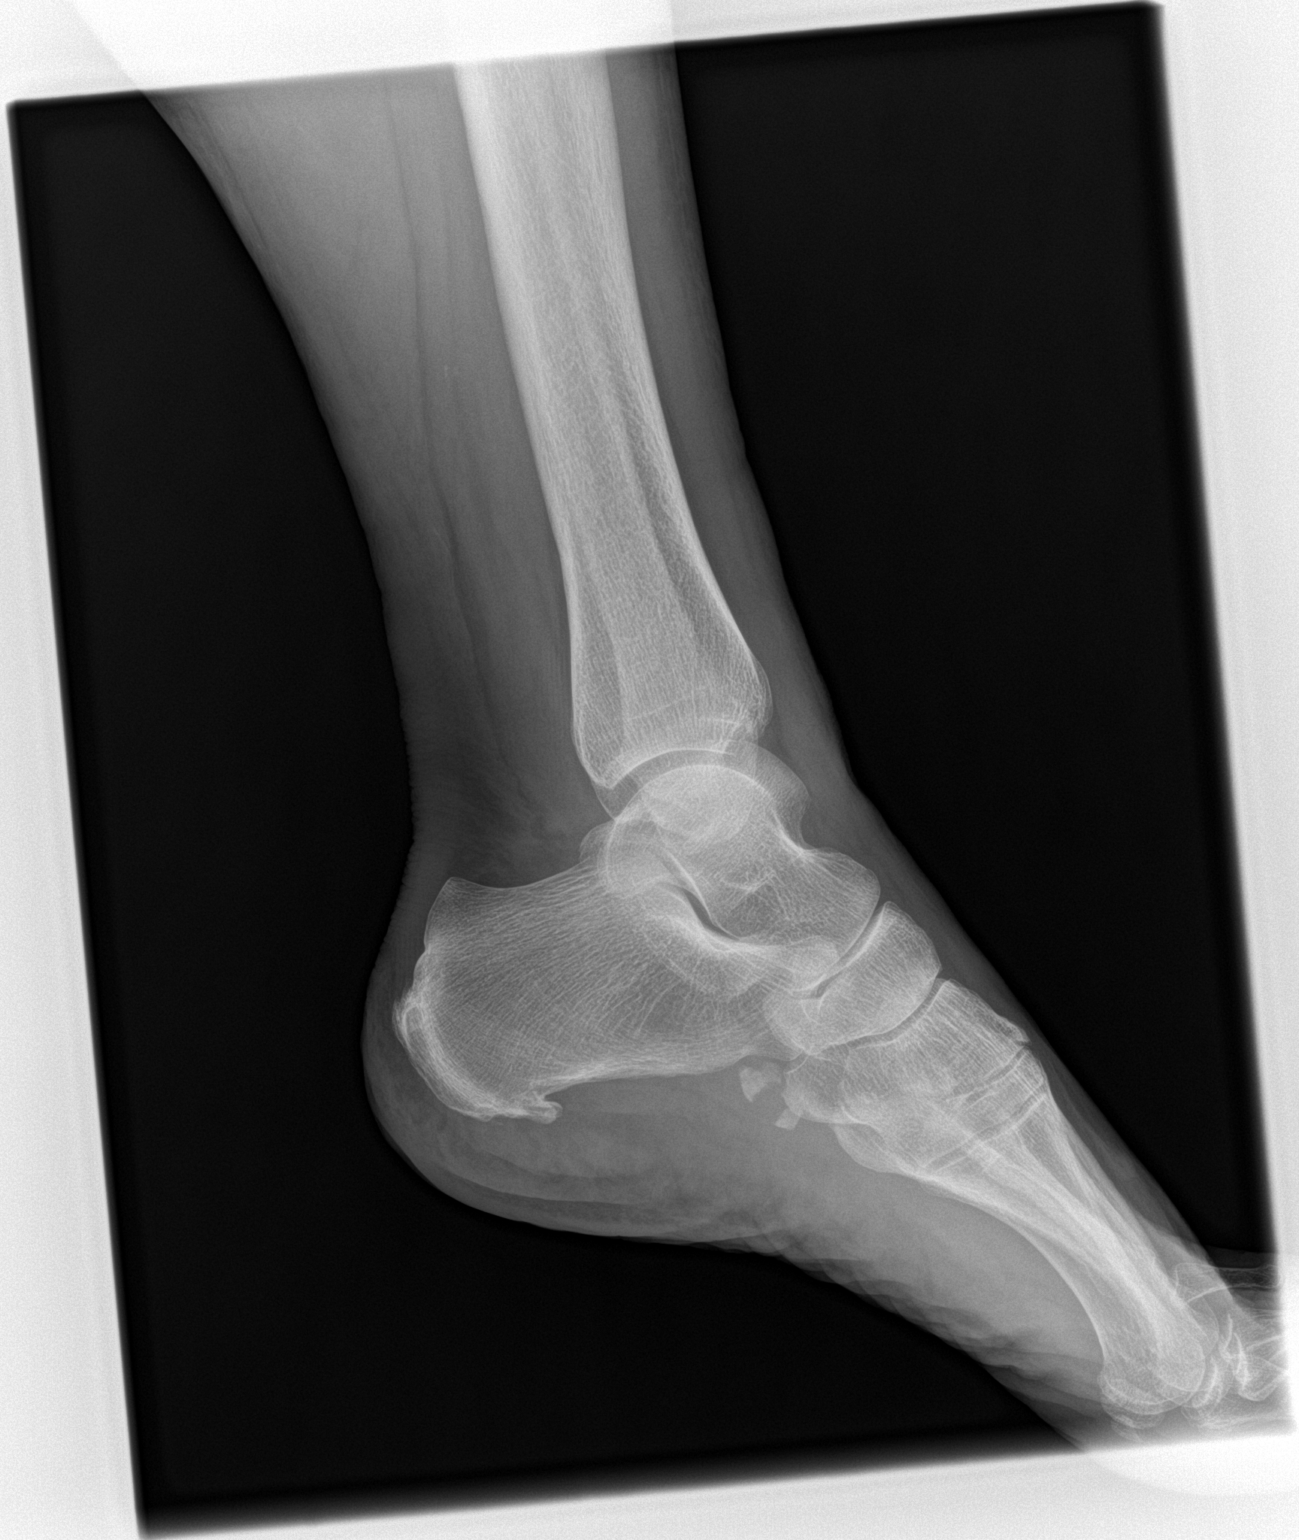

[3 of 3 positions shown; findings below may reference images not displayed]

FINDINGS: As described on the foot radiographs, there is suspicion of a
distracted fracture of an os peroneum. There is mild spurring of the
medial malleolus. No evidence of acute fracture or dislocation at
the ankle. No focal soft tissue swelling.
IMPRESSION: No acute findings at the ankle. Suspected fracture of an os
peroneum. See separate foot report.

## 2019-12-11 ENCOUNTER — Ambulatory Visit: Payer: Medicare HMO | Admitting: Physical Therapy

## 2019-12-15 ENCOUNTER — Encounter: Payer: Self-pay | Admitting: Internal Medicine

## 2019-12-15 DIAGNOSIS — M810 Age-related osteoporosis without current pathological fracture: Secondary | ICD-10-CM | POA: Diagnosis not present

## 2019-12-15 DIAGNOSIS — I2510 Atherosclerotic heart disease of native coronary artery without angina pectoris: Secondary | ICD-10-CM | POA: Diagnosis not present

## 2019-12-15 DIAGNOSIS — E782 Mixed hyperlipidemia: Secondary | ICD-10-CM | POA: Diagnosis not present

## 2019-12-15 DIAGNOSIS — R7303 Prediabetes: Secondary | ICD-10-CM | POA: Diagnosis not present

## 2019-12-15 DIAGNOSIS — N183 Chronic kidney disease, stage 3 unspecified: Secondary | ICD-10-CM | POA: Diagnosis not present

## 2019-12-15 DIAGNOSIS — Z6824 Body mass index (BMI) 24.0-24.9, adult: Secondary | ICD-10-CM | POA: Diagnosis not present

## 2019-12-15 DIAGNOSIS — E890 Postprocedural hypothyroidism: Secondary | ICD-10-CM | POA: Diagnosis not present

## 2019-12-15 DIAGNOSIS — I10 Essential (primary) hypertension: Secondary | ICD-10-CM | POA: Diagnosis not present

## 2019-12-22 ENCOUNTER — Ambulatory Visit: Payer: Medicare HMO | Attending: Physician Assistant | Admitting: Physical Therapy

## 2019-12-22 ENCOUNTER — Encounter: Payer: Self-pay | Admitting: Physical Therapy

## 2019-12-22 ENCOUNTER — Other Ambulatory Visit: Payer: Self-pay

## 2019-12-22 DIAGNOSIS — G8929 Other chronic pain: Secondary | ICD-10-CM | POA: Insufficient documentation

## 2019-12-22 DIAGNOSIS — M545 Low back pain, unspecified: Secondary | ICD-10-CM

## 2019-12-22 DIAGNOSIS — M6281 Muscle weakness (generalized): Secondary | ICD-10-CM | POA: Diagnosis not present

## 2019-12-22 NOTE — Therapy (Signed)
[place], [place], [address] Phone: [phone]   Fax:  [phone]  Physical Therapy Treatment / Discharge   PHYSICAL THERAPY DISCHARGE SUMMARY  Visits from Start of Care: 3  Current functional level related to goals / functional outcomes: Patient has achieved all functional goals   Remaining deficits: None   Education / Equipment: HEP Plan: Patient agrees to discharge.  Patient goals were met. Patient is being discharged due to meeting the stated rehab goals.  ?????     Patient Details  Name: SILAS SEDAM MRN: [id number] Date of Birth: Jul 21, 1951 Referring Provider (PT): Cyndi Bender, [place]   Encounter Date: 12/22/2019  PT End of Session - 12/22/19 0921    Visit Number  3    Authorization Type  [employer] M[employer]    PT Start Time  0915    PT Stop Time  0950    PT Time Calculation (min)  35 min    Activity Tolerance  Patient tolerated treatment well    Behavior During Therapy  [facility] for tasks assessed/performed       Past Medical History:  Diagnosis Date  . Arthritis   . Complication of anesthesia   . Heart murmur   . Hx of adenomatous polyp of colon 08/11/2015  . Hyperlipidemia   . Hypertension   . Hypothyroidism   . Impaired glucose tolerance   . Patella fracture right    2014  . Plantar fascial fibromatosis    left foot  . PONV (postoperative nausea and vomiting)     Past Surgical History:  Procedure Laterality Date  . APPENDECTOMY  1981  . COLON RESECTION  1985   benign tumor  . FOOT SURGERY Left 2003  . patella fracture repair Left 2007  . PLANTAR FASCIA RELEASE Left 02/09/2016   Procedure: LEFT FOOT ENDOSCOPIC PLANTAR FASCIOTOMY;  Surgeon: [name], MD;  Location: [place];  Service: Orthopedics;  Laterality: Left;  . THYROIDECTOMY  1995  . VAGINAL HYSTERECTOMY  1983    There were no vitals filed for this visit.  Subjective Assessment - 12/22/19  0919    Subjective  Patient reports she is doing really well and no longer having any symptoms. Now the weather is nicer she is able to get out and walk more.    How long can you sit comfortably?  No limitation    How long can you stand comfortably?  No limitation    How long can you walk comfortably?  No limitation    Patient Stated Goals  Reduce sciatica flare ups so she can return to prior level of function with no limitations.    Currently in Pain?  No/denies         [facility] PT Assessment - 12/22/19 0001      Assessment   Medical Diagnosis  Low back pain, left sided sciatica    Referring Provider (PT)  Cyndi Bender, PA-C      Prior Function   Level of Independence  Independent      Observation/Other Assessments   Focus on Therapeutic Outcomes (FOTO)   26% limitation      AROM   Overall AROM Comments  All lumbar AROM WFL and pain free      Strength   Right Hip Flexion  4+/5    Right Hip Extension  4+/5    Right Hip ABduction  4+/5    Left Hip Flexion  4+/5    Left  Hip Extension  4+/5    Left Hip ABduction  4+/5                   [facility] Adult PT Treatment/Exercise - 12/22/19 0001      Self-Care   Self-Care  Other Self-Care Comments    Other Self-Care Comments   Walking program, consistent HEP, using pillow behind low back when sitting extended periods      Exercises   Exercises  Lumbar      Lumbar Exercises: Stretches   Single Knee to Chest Stretch  2 reps;20 seconds    Lower Trunk Rotation Limitations  x20    Piriformis Stretch  2 reps;20 seconds    Piriformis Stretch Limitations  supine      Lumbar Exercises: Seated   Sit to Stand  10 reps      Lumbar Exercises: Supine   Bridge  10 reps;5 seconds    Bridge Limitations  feet together, knees pushed apart    Straight Leg Raise  10 reps             PT Education - 12/22/19 0921    Education Details  HEP    Person(s) Educated  Patient    Methods  Explanation    Comprehension  Verbalized  understanding;Returned demonstration       PT Short Term Goals - 12/22/19 0926      PT SHORT TERM GOAL #1   Title  Patient will be independent with initial HEP to progress in PT.    Time  4    Period  Weeks    Status  Achieved    Target Date  11/30/19      PT SHORT TERM GOAL #2   Title  Patient will be able to sit > 1 hour with </= 1-2/10 pain level to indicate improved sitting tolerance.    Time  4    Period  Weeks    Status  Achieved        PT Long Term Goals - 12/22/19 3212      PT LONG TERM GOAL #1   Title  FOTO score will improve from 35%  limited to 26% limited  to demo improved function and mobility.    Baseline  26% limitation    Time  6    Period  Weeks    Status  Achieved      PT LONG TERM GOAL #2   Title  Patient will be able to sit and lie down for as long as she wants without increased pain level.    Time  6    Period  Weeks    Status  Achieved      PT LONG TERM GOAL #3   Title  Patient will exhibit improved left hip strength of >/= 4+/5 MMT grossly to allow for improve activity tolerance without pain.    Time  6    Period  Weeks    Status  Achieved            Plan - 12/22/19 2482    Clinical Impression Statement  Patient is doing very well with no pain and she has met all established goals. She will be discharged to independent HEP as formal PT is no longr indicated.    PT Home Exercise Plan  Supine piriformis and figure-4 stretch, SLR, clamshell with yellow, bridge with yellow, sit-to-stand    Consulted and Agree with Plan of Care  Patient  Patient will benefit from skilled therapeutic intervention in order to improve the following deficits and impairments:     Visit Diagnosis: Chronic bilateral low back pain, unspecified whether sciatica present  Muscle weakness (generalized)     Problem List Patient Active Problem List   Diagnosis Date Noted  . Mild mitral and aortic regurgitation 01/27/2018  . Aortic valve stenosis  01/27/2018  . Sinus bradycardia 01/27/2018  . Paroxysmal atrial tachycardia ([place]) 01/27/2018  . Mixed hyperlipidemia 01/27/2018  . Coronary artery disease involving native coronary artery of native heart without angina pectoris 02/25/2017  . Valvular heart disease 02/25/2017  . Essential hypertension 02/25/2017  . Hypertensive heart disease 09/26/2016  . Hx of adenomatous polyp of colon 08/11/2015  . Family history of colon cancer - father + sister both in 104's 08/11/2015    Hilda Blades, PT, DPT, LAT, ATC 12/22/19  10:05 AM Phone: [phone] Fax: [facility] [address] [place], [place], [address] Phone: [phone]   Fax:  [phone]  Name: LIZZETE GOUGH MRN: [id number] Date of Birth: September 29, 1951

## 2019-12-23 ENCOUNTER — Ambulatory Visit (INDEPENDENT_AMBULATORY_CARE_PROVIDER_SITE_OTHER): Payer: Medicare HMO

## 2019-12-23 DIAGNOSIS — I350 Nonrheumatic aortic (valve) stenosis: Secondary | ICD-10-CM | POA: Diagnosis not present

## 2019-12-23 DIAGNOSIS — I38 Endocarditis, valve unspecified: Secondary | ICD-10-CM | POA: Diagnosis not present

## 2019-12-23 NOTE — Progress Notes (Signed)
Virtual Visit via Telephone Note   This visit type was conducted due to national recommendations for restrictions regarding the COVID-19 Pandemic (e.g. social distancing) in an effort to limit this patient's exposure and mitigate transmission in our community.  Due to her co-morbid illnesses, this patient is at least at moderate risk for complications without adequate follow up.  This format is felt to be most appropriate for this patient at this time.  The patient did not have access to video technology/had technical difficulties with video requiring transitioning to audio format only (telephone).  All issues noted in this document were discussed and addressed.  No physical exam could be performed with this format.  Please refer to the patient's chart for her  consent to telehealth for [facility].   Date:  12/25/2019   ID:  RAHF ROSDAHL, DOB 1951/10/08, MRN [id number]  Patient Location: Home Provider Location: Home  PCP:  [facility]  Cardiologist:  Nelva Bush, MD  Electrophysiologist:  None   Evaluation Performed:  Follow-Up Visit  Chief Complaint: Follow-up shortness of breath  History of Present Illness:    Debra Barry is a 69 y.o. female with coronary artery disease (medically managed), aortic and mitral regurgitation, paroxysmal atrial tachycardia, hypertension, hyperlipidemia, impaired glucose tolerance, and hypothyroidism.  We are speaking today for follow-up of her chronic dyspnea, valvular heart disease, and coronary artery disease.  I last saw Debra Barry in 05/2019.  Today, she reports feeling about the same.  She still has some exertional dyspnea when she is very active, unchanged from prior visits.  She notes her blood pressure has been a little bit more elevated recently, which she attributes to stress.  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  She has no longer seeing a  therapist, as this did not seem to be working very well.  Debra Barry denies chest pain, palpitations, and lightheadedness.  She has noted some intermittent epigastric tenderness.  She is tolerating Repatha well.  The patient does not have symptoms concerning for COVID-19 infection (fever, chills, cough, or new shortness of breath).    Past Medical History:  Diagnosis Date   Arthritis    Complication of anesthesia    Heart murmur    Hx of adenomatous polyp of colon 08/11/2015   Hyperlipidemia    Hypertension    Hypothyroidism    Impaired glucose tolerance    Patella fracture right    2014   Plantar fascial fibromatosis    left foot   PONV (postoperative nausea and vomiting)    Past Surgical History:  Procedure Laterality Date   [place]   benign tumor   FOOT SURGERY Left 2003   patella fracture repair Left 2007   PLANTAR FASCIA RELEASE Left 02/09/2016   Procedure: LEFT FOOT ENDOSCOPIC PLANTAR FASCIOTOMY;  Surgeon: [name], MD;  Location: [place];  Service: Orthopedics;  Laterality: Left;   THYROIDECTOMY  1995   VAGINAL HYSTERECTOMY  1983     Current Meds  Medication Sig   alendronate (FOSAMAX) 70 MG tablet Take 70 mg by mouth once a week. Take with a full glass of water on an empty stomach.   amLODipine (NORVASC) 10 MG tablet Take 10 mg by mouth daily.   aspirin EC 81 MG tablet Take 81 mg by mouth daily.   cyclobenzaprine (FLEXERIL) 10 MG tablet Take  10 mg by mouth daily as needed.   Evolocumab (REPATHA SURECLICK) [id number] MG/ML SOAJ Inject 1 pen into the skin every 14 (fourteen) days.   hydrochlorothiazide (MICROZIDE) 12.5 MG capsule 1 capsule (12.5 mg total) daily. Pt reports currently taking this medication   levothyroxine (SYNTHROID, LEVOTHROID) 75 MCG tablet    multivitamin-lutein (OCUVITE-LUTEIN) CAPS capsule Take 1 capsule by mouth daily.   Omega-3 Fatty Acids (FISH OIL) 1000 MG CPDR  Take 2 capsules by mouth 2 (two) times daily.   Polyvinyl Alcohol-Povidone (REFRESH OP) Apply 1 drop to eye 4 (four) times daily as needed (dry eyes).    Probiotic Product (PROBIOTIC-10 PO) Take 1 capsule by mouth daily.   telmisartan (MICARDIS) 80 MG tablet Take 80 mg by mouth daily.   Tetrahydrozoline HCl (EYE DROPS OP) Place 1 drop into both eyes at bedtime as needed. Refresh eye gel     Allergies:   Citric acid, Codeine, Penicillins, Statins, Tricor [fenofibrate], Verapamil, and Sulfa antibiotics   Social History   Tobacco Use   Smoking status: Former Smoker    Packs/day: 0.25    Types: Cigarettes    Quit date: 10/22/1974    Years since quitting: 45.2   Smokeless tobacco: Never Used  Substance Use Topics   Alcohol use: No    Alcohol/week: 0.0 standard drinks   Drug use: No     Family Hx: The patient's family history includes Glaucoma in her mother; Heart disease in her maternal grandfather; Hepatitis in her father; Hyperlipidemia in her mother; Hypertension in her mother; Rectal cancer in her father; Stroke in her mother. There is no history of Colon cancer.  ROS:   Please see the history of present illness.   All other systems reviewed and are negative.   Prior CV studies:   The following studies were reviewed today:  TTE (12/23/2019): Normal LV size with LVEF of 55-60% and grade 2 diastolic dysfunction.  Normal RV size and function.  Mild mitral and aortic regurgitation.  Cystic structure noted in the liver, measuring approximately 4.4 x 4.1 cm.  Labs/Other Tests and Data Reviewed:    EKG:  No ECG reviewed.  Recent Labs: No results found for requested labs within last 8760 hours.   Recent Lipid Panel Lab Results  Component Value Date/Time   CHOL 143 07/21/2018 09:43 AM   TRIG 209 (H) 07/21/2018 09:43 AM   HDL 38 (L) 07/21/2018 09:43 AM   CHOLHDL 3.8 07/21/2018 09:43 AM   CHOLHDL 3.6 09/27/2016 09:32 AM   LDLCALC 63 07/21/2018 09:43 AM    Wt Readings  from Last 3 Encounters:  12/24/19 142 lb (64.4 kg)  05/27/19 145 lb 8 oz (66 kg)  07/21/18 142 lb (64.4 kg)     Objective:    Vital Signs:  BP (!) 154/75 (BP Location: Left Arm, Patient Position: Sitting, Cuff Size: Normal)    Pulse (!) 52    Ht 5\' 4"  (1.626 m)    Wt 142 lb (64.4 kg)    LMP  (LMP Unknown)    BMI 24.37 kg/m    VITAL SIGNS:  reviewed  ASSESSMENT & PLAN:    Coronary artery disease: Diagnosis made by myocardial perfusion stress test in 08/2016, demonstrating a small mid anterior defect.  Given the lack of symptoms, we have agreed to defer additional testing.  We will continue current medications to prevent progression of disease, including lipid therapy with Repatha.  Valvular heart disease: Echo performed earlier this week showed mild mitral and aortic  regurgitation.  Debra Barry has stable NYHA class II symptoms.  We will continue current medications.  I have asked her to keep monitoring her blood pressure and to alert [place] if it is consistently above 140/90.  Hepatic cyst and epigastric pain: Cystic structure in the liver again noted on echocardiogram earlier this week.  We had discussed this both before and follow-up was to be performed by her PCP, though Debra Barry is unsure that this was ever performed.  Given development of intermittent pain, we will proceed with abdominal ultrasound to better characterize the cystic liver structure and to exclude other pathology contributing to her epigastric discomfort.  Paroxysmal atrial tachycardia: Debra Barry denies palpitations.  Given history of resting bradycardia, we will defer addition of beta-blocker or nondihydropyridine calcium channel blocker.  Hyperlipidemia: Recent outside labs notable for LDL of 110, which is still above goal.  However, given patient's intolerance of statins, I think it is reasonable to continue with monotherapy with Repatha.  Lifestyle modifications were reinforced.  Hypertension: Mildly elevated  today but labile at home.  Debra Barry believe stress is contributing.  She has had issues with some blood pressure medicines in the past; we have deferred changes to her regimen today.  She will alert [place] if her blood pressures consistently above 140/90.  I encouraged her to limit her sodium intake.  COVID-19 Education: The signs and symptoms of COVID-19 were discussed with the patient and how to seek care for testing (follow up with PCP or arrange E-visit).  The importance of social distancing was discussed today.  Time:   Today, I have spent 20 minutes with the patient with telehealth technology discussing the above problems.     Medication Adjustments/Labs and Tests Ordered: Current medicines are reviewed at length with the patient today.  Concerns regarding medicines are outlined above.   Tests Ordered: Orders Placed This Encounter  Procedures   US Abdomen Complete    Medication Changes: None.  Follow Up:  In Person in 6 month(s)  Signed, Nelva Bush, MD  12/25/2019 5:30 PM    [place]

## 2019-12-24 ENCOUNTER — Encounter: Payer: Self-pay | Admitting: Internal Medicine

## 2019-12-24 ENCOUNTER — Telehealth (INDEPENDENT_AMBULATORY_CARE_PROVIDER_SITE_OTHER): Payer: Medicare HMO | Admitting: Internal Medicine

## 2019-12-24 ENCOUNTER — Other Ambulatory Visit: Payer: Self-pay

## 2019-12-24 VITALS — BP 154/75 | HR 52 | Ht 64.0 in | Wt 142.0 lb

## 2019-12-24 DIAGNOSIS — E782 Mixed hyperlipidemia: Secondary | ICD-10-CM

## 2019-12-24 DIAGNOSIS — R1013 Epigastric pain: Secondary | ICD-10-CM | POA: Diagnosis not present

## 2019-12-24 DIAGNOSIS — I10 Essential (primary) hypertension: Secondary | ICD-10-CM

## 2019-12-24 DIAGNOSIS — K7689 Other specified diseases of liver: Secondary | ICD-10-CM

## 2019-12-24 DIAGNOSIS — I2510 Atherosclerotic heart disease of native coronary artery without angina pectoris: Secondary | ICD-10-CM

## 2019-12-24 DIAGNOSIS — I471 Supraventricular tachycardia: Secondary | ICD-10-CM

## 2019-12-24 DIAGNOSIS — I38 Endocarditis, valve unspecified: Secondary | ICD-10-CM | POA: Diagnosis not present

## 2019-12-24 NOTE — Patient Instructions (Addendum)
Medication Instructions:  Your physician recommends that you continue on your current medications as directed. Please refer to the Current Medication list given to you today.  *If you need a refill on your cardiac medications before your next appointment, please call your pharmacy*   Lab Work: none If you have labs (blood work) drawn today and your tests are completely normal, you will receive your results only by: Marland Kitchen MyChart Message (if you have MyChart) OR . A paper copy in the mail If you have any lab test that is abnormal or we need to change your treatment, we will call you to review the results.   Testing/Procedures: Your physician recommends you have an Abdominal Ultrasound for evaluation of Epigastric Pain and liver cyst noted on echocardiogram. [facility]  [address], [place] [phone] Please call to schedule at your convenience.   Follow-Up: At [facility], you and your health needs are our priority.  As part of our continuing mission to provide you with exceptional heart care, we have created designated Provider Care Teams.  These Care Teams include your primary Cardiologist (physician) and Advanced Practice Providers (APPs -  Physician Assistants and Nurse Practitioners) who all work together to provide you with the care you need, when you need it.  We recommend signing up for the patient portal called "MyChart".  Sign up information is provided on this After Visit Summary.  MyChart is used to connect with patients for Virtual Visits (Telemedicine).  Patients are able to view lab/test results, encounter notes, upcoming appointments, etc.  Non-urgent messages can be sent to your provider as well.   To learn more about what you can do with MyChart, go to [email or web address].    Your next appointment:   6 month(s)   Already scheduled.   The format for your next appointment:   In Person  Provider:    You may see Nelva Bush, MD or one of  the following Advanced Practice Providers on your designated Care Team:    Murray Hodgkins, NP  Christell Faith, PA-C  Marrianne Mood, PA-C

## 2019-12-25 DIAGNOSIS — K7689 Other specified diseases of liver: Secondary | ICD-10-CM | POA: Insufficient documentation

## 2019-12-28 DIAGNOSIS — Z1231 Encounter for screening mammogram for malignant neoplasm of breast: Secondary | ICD-10-CM | POA: Diagnosis not present

## 2019-12-28 DIAGNOSIS — M85851 Other specified disorders of bone density and structure, right thigh: Secondary | ICD-10-CM | POA: Diagnosis not present

## 2019-12-28 DIAGNOSIS — M85852 Other specified disorders of bone density and structure, left thigh: Secondary | ICD-10-CM | POA: Diagnosis not present

## 2020-01-01 DIAGNOSIS — L258 Unspecified contact dermatitis due to other agents: Secondary | ICD-10-CM | POA: Diagnosis not present

## 2020-01-01 DIAGNOSIS — L720 Epidermal cyst: Secondary | ICD-10-CM | POA: Diagnosis not present

## 2020-01-06 ENCOUNTER — Ambulatory Visit
Admission: RE | Admit: 2020-01-06 | Discharge: 2020-01-06 | Disposition: A | Discharge status: Home / Self Care | Payer: Medicare HMO | Source: Ambulatory Visit | Attending: Internal Medicine | Admitting: Internal Medicine

## 2020-01-06 DIAGNOSIS — R1013 Epigastric pain: Secondary | ICD-10-CM

## 2020-01-06 DIAGNOSIS — K824 Cholesterolosis of gallbladder: Secondary | ICD-10-CM | POA: Diagnosis not present

## 2020-01-06 DIAGNOSIS — K7689 Other specified diseases of liver: Secondary | ICD-10-CM

## 2020-01-11 DIAGNOSIS — Z6824 Body mass index (BMI) 24.0-24.9, adult: Secondary | ICD-10-CM | POA: Diagnosis not present

## 2020-01-11 DIAGNOSIS — M79672 Pain in left foot: Secondary | ICD-10-CM | POA: Diagnosis not present

## 2020-01-11 DIAGNOSIS — R609 Edema, unspecified: Secondary | ICD-10-CM | POA: Diagnosis not present

## 2020-01-12 ENCOUNTER — Other Ambulatory Visit: Payer: Self-pay | Admitting: Physician Assistant

## 2020-01-12 ENCOUNTER — Ambulatory Visit
Admission: RE | Admit: 2020-01-12 | Discharge: 2020-01-12 | Disposition: A | Discharge status: Home / Self Care | Payer: Medicare HMO | Source: Ambulatory Visit | Attending: Physician Assistant | Admitting: Physician Assistant

## 2020-01-12 DIAGNOSIS — M79672 Pain in left foot: Secondary | ICD-10-CM

## 2020-01-12 DIAGNOSIS — S92352A Displaced fracture of fifth metatarsal bone, left foot, initial encounter for closed fracture: Secondary | ICD-10-CM | POA: Diagnosis not present

## 2020-01-15 DIAGNOSIS — L258 Unspecified contact dermatitis due to other agents: Secondary | ICD-10-CM | POA: Diagnosis not present

## 2020-02-03 DIAGNOSIS — R21 Rash and other nonspecific skin eruption: Secondary | ICD-10-CM | POA: Diagnosis not present

## 2020-02-03 DIAGNOSIS — J309 Allergic rhinitis, unspecified: Secondary | ICD-10-CM | POA: Diagnosis not present

## 2020-02-18 ENCOUNTER — Encounter: Payer: Self-pay | Admitting: Speech Pathology

## 2020-02-18 ENCOUNTER — Ambulatory Visit: Payer: Medicare HMO | Attending: Physician Assistant | Admitting: Speech Pathology

## 2020-02-18 ENCOUNTER — Other Ambulatory Visit: Payer: Self-pay

## 2020-02-18 DIAGNOSIS — R498 Other voice and resonance disorders: Secondary | ICD-10-CM

## 2020-02-18 NOTE — Patient Instructions (Signed)
  Abdominal Breathing exercises . Shoulders down - this is a cue to relax . Place your hand on your abdomen - this helps you focus on easy abdominal breath support - the best and most relaxed way to breathe . Breathe in through your nose and fill your belly with air, watching your hand move outward . Breathe out through your mouth and watch your belly move in. An audible "sh"  may help   Think of your belly as a balloon, when you fill with air (inhale), the balloon gets bigger. As the air goes out (exhale), the balloon deflates.  If you are having difficulty coordinating this, lay on your back with a plastic cup on your belly and repeat the above steps, watching you belly move up with inhalation and down with exhalations  Practice breathing in and out in front of a mirror, watching your belly Breathe in for a count of 5 and breathe out for a count of 5  PHoRTE - twice a day. Abdominal breath before each exercise  [email or web address].[email or web address]  1.  10 Loud AH's as loud as you can and as long as you can       To help coach you at home with your loud AH!!       YouTube: Speech Therapy Practice - Sustained Phonation /ah/ 10 times - Phonatory                  Resistance Strengthening Exercises Speech Therapy       https://www.smith.com/  2. Pitch glides while you count (1-10, skip 7)  3. 10 sentences in loud high pitch voice, like you are calling your neighbor over the fence  4. 10 sentences in loud low authoritative pitch, like you are the boss  Use a good belly breath before each exercise- feel your abs contract in as you use your voice  Use a good abdominal breath to power your voice when talking

## 2020-02-19 NOTE — Therapy (Signed)
[facility] [address] [place], [place], [address] Phone: [phone]   Fax:  [phone]  Speech Language Pathology Evaluation  Debra Barry Details  Name: Debra Barry MRN: [id number] Date of Birth: 08-19-1951 Referring Provider (SLP): Dr. Blenda Nicely   Encounter Date: 02/18/2020  End of Session - 02/18/20 1400   Visit Number  1    Number of Visits  5    Date for SLP Re-Evaluation  04/19/20    Authorization Type  [employer], Prior Auth required    SLP Start Time  1400    SLP Stop Time   1445    SLP Time Calculation (min)  45 min    Activity Tolerance  Debra Barry tolerated treatment well       Past Medical History:  Diagnosis Date  . Arthritis   . Complication of anesthesia   . Heart murmur   . Hx of adenomatous polyp of colon 08/11/2015  . Hyperlipidemia   . Hypertension   . Hypothyroidism   . Impaired glucose tolerance   . Patella fracture right    2014  . Plantar fascial fibromatosis    left foot  . PONV (postoperative nausea and vomiting)     Past Surgical History:  Procedure Laterality Date  . APPENDECTOMY  1981  . COLON RESECTION  1985   benign tumor  . FOOT SURGERY Left 2003  . patella fracture repair Left 2007  . PLANTAR FASCIA RELEASE Left 02/09/2016   Procedure: LEFT FOOT ENDOSCOPIC PLANTAR FASCIOTOMY;  Surgeon: [name], MD;  Location: [facility];  Service: Orthopedics;  Laterality: Left;  . THYROIDECTOMY  1995  . VAGINAL HYSTERECTOMY  1983    There were no vitals filed for this visit.  Subjective Assessment - 02/18/20 1406    Subjective  "I've still got this problem. When I'm on the phone talking, it's like it'll just stop."    Currently in Pain?  No/denies         SLP Evaluation [facility] - 02/18/20 1400     SLP Visit Information   SLP Received On  02/18/20    Referring Provider (SLP)  Dr. Blenda Nicely    Onset Date  referral 02/11/20    Medical Diagnosis  dysphonia       Subjective   Debra Barry/Family Stated Goal  "strengthening the muscles if I can."      Pain Assessment   Currently in Pain?  No/denies      General Information   HPI  Debra Barry is a 69 y.o. female referred by Dr. Blenda Nicely for vocal fold atrophy. Past medical history noted for HTN, osteopenia, goiter s/p thyroidectomy. Pt was evaluated by ENT on 01/02/19; CT neck was recommended due to findings of fullness and firmness behind the right sternoclavicular head; these findings were normal (01/13/19). Laryngoscopy findings: "The vocal folds are mobile bilaterally. There is a persistent glottic gap throughout all phases with slightly atrophic vocal cords bilaterally. There are no lesions or significant inflammation. There is no glottal insufficiency." She did not receive voice therapy immediately after her ENT evaluation as this referral was delayed due to onset of the Covid-19 pandemic.    Behavioral/Cognition  alert, cooperative    Mobility Status  ambulated unassisted      Balance Screen   Has the Debra Barry fallen in the past 6 months  No    Has the Debra Barry had a decrease in activity level because of a fear of falling?   No  Is the Debra Barry reluctant to leave their home because of a fear of falling?   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     Cognition   Overall Cognitive Status  Within Functional Limits for tasks assessed      Auditory Comprehension   Overall Auditory Comprehension  Appears within functional limits for tasks assessed      Visual Recognition/Discrimination   Discrimination  Not tested      Reading Comprehension   Reading Status  Within funtional limits      Expression   Primary Mode of Expression  Verbal      Verbal Expression   Overall Verbal Expression  Appears within functional limits for tasks assessed      Written Expression    Written Expression  Not tested      Oral Motor/Sensory Function   Overall Oral Motor/Sensory Function  Appears within functional limits for tasks assessed      Motor Speech   Respiration  Impaired   chest-centered breathing pattern   Level of Impairment  Conversation   pt reports she runs out of air when speaking.    Phonation  Other (comment)   vocal quality is WNL; duration/breath groups reduced   Resonance  Within functional limits    Articulation  Within functional limitis    Intelligibility  Intelligible    Motor Planning  Witnin functional limits    Phonation  WFL    Volume  Decibel Level   average WNL for quiet environment, low 70s dB at 30cm   Pitch  Appropriate   modal pitch 184.9 Hz (WNL); pt reports lower than her normal     Standardized Assessments   Standardized Assessments   Other Assessment    Other Assessment  Debra Barry reports drinking water throughout the day to keep her throat moist. She also uses a humidifier at night. She is a non-smoker. She used to sing, but reports she is now unable to sustain voice for singing. She feels she runs out of air when speaking in longer conversations and over the phone. She feels she has to strain and has difficulty projecting her voice over noise. In 8 minutes of simple conversation in a quiet environment, pt vocal intensity was within normal limits, ranging from 67dB to 78dB, averaging 70.5dB at distance of 30 cm (70-72 is WNL). Pitch range in conversation was 155.6 Hz to 261.6 Hz, with modal pitch of 184.9 Hz. This is WNL for age/gender, however pt feels her pitch has lowered. Her maximum phonation time for /a/ was 4.9 seconds, which is abnormal. Vocal quality was [facility], subjectively no hoarseness or dysphonia noted. There was no change in vocal quality or hoarseness with vowel prolongation; pt was unable to sustain for longer periods even with cues, "It just cuts off." /s/ to /z/ ratio was .95. VRQOL raw score 23, which is a calculated  score of 67.5 (fair). Initiated training in abdominal breathing and PhoRTE exercises. Debra Barry required moderate cues for technique.                           SLP Long Term Goals - 02/18/20 1400     SLP LONG TERM GOAL #1   Title  Debra Barry will demonstrate 80% accuracy with  abdominal breathing during 5 minutes simple conversation.    Time  4    Period  Weeks    Status  New      SLP LONG TERM GOAL #2   Title  Debra Barry will demo HEP for VF adduction with modified independence.    Time  4    Period  Weeks    Status  New       Plan - 02/18/20 1400   Clinical Impression Statement  Debra Barry presents today for ST evaluation for vocal fold atrophy, diagnosed by Dr. Blenda Nicely in March of 2020. Debra Barry did not receive therapy at that time due to onset of Covid-19 pandemic. Her vocal quality is judged to be WNL throughout assessment today; Debra Barry reports frustration with her ability to sustain and project her voice. See "assessment" for objective data. Debra Barry requesting exercises she can do at home by herself. SLP recommended at least some additional visits to ensure pt is completing exercises with appropriate technique and to improve changes of carryover of breathing and vocal techniques to conversation. Therapist discussed treatment options and recommended schedule of 2x per week for 6 weeks, with potential to decrease frequency or earlier discharge depending on pt progress. At this time Debra Barry wishes to commit to 1x per week for 4 weeks. I recommend brief course of ST to train Debra Barry in exercises for vocal fold atrophy to improve pt's vocal endurance for conversations with family, friends, and over the phone.    Speech Therapy Frequency  1x /week    Duration  4 weeks   SLP recommended 2x per week for 6 weeks; 1 wk 4 is pt request   Treatment/Interventions  Cueing hierarchy;SLP instruction and feedback;Functional tasks;Debra Barry/family education    Potential to Achieve Goals   Fair    Potential Considerations  Financial resources;Ability to learn/carryover information    SLP Home Exercise Plan  abdominal breathing and PhoRTE exercises given    Consulted and Agree with Plan of Care  Debra Barry       Debra Barry will benefit from skilled therapeutic intervention in order to improve the following deficits and impairments:   Other voice and resonance disorders    Problem List Debra Barry Active Problem List   Diagnosis Date Noted  . Hepatic cyst 12/25/2019  . Mild mitral and aortic regurgitation 01/27/2018  . Aortic valve stenosis 01/27/2018  . Sinus bradycardia 01/27/2018  . Paroxysmal atrial tachycardia ([place]) 01/27/2018  . Mixed hyperlipidemia 01/27/2018  . Coronary artery disease involving native coronary artery of native heart without angina pectoris 02/25/2017  . Valvular heart disease 02/25/2017  . Essential hypertension 02/25/2017  . Hypertensive heart disease 09/26/2016  . Hx of adenomatous polyp of colon 08/11/2015  . Family history of colon cancer - father + sister both in 46's 08/11/2015    Debra Barry, [place], [place] E Bookert Guzzi 02/19/2020, 10:06 AM  [facility] [address] [place], [place], [address] Phone: [phone]   Fax:  [phone]  Name: TANZY STUMBAUGH MRN: [id number] Date of Birth: Nov 28, 1950

## 2020-03-11 DIAGNOSIS — M25572 Pain in left ankle and joints of left foot: Secondary | ICD-10-CM | POA: Diagnosis not present

## 2020-03-24 ENCOUNTER — Ambulatory Visit: Payer: Medicare HMO | Admitting: Speech Pathology

## 2020-03-31 ENCOUNTER — Ambulatory Visit: Payer: Medicare HMO | Attending: Physician Assistant | Admitting: Speech Pathology

## 2020-03-31 ENCOUNTER — Other Ambulatory Visit: Payer: Self-pay

## 2020-03-31 DIAGNOSIS — R498 Other voice and resonance disorders: Secondary | ICD-10-CM | POA: Diagnosis not present

## 2020-03-31 NOTE — Therapy (Signed)
[facility] [address] [place], [place], [address] Phone: [phone]   Fax:  [phone]  Speech Language Pathology Treatment  Patient Details  Name: Debra Barry MRN: [id number] Date of Birth: [birth year] Referring Provider (SLP): Dr. Blenda Barry   Encounter Date: 03/31/2020   End of Session - 03/31/20 1151    Visit Number 2    Number of Visits 5    Date for SLP Re-Evaluation 04/19/20    Authorization Type [employer], Prior Auth required    SLP Start Time 1100    SLP Stop Time  1145    SLP Time Calculation (min) 45 min    Activity Tolerance Patient tolerated treatment well           Past Medical History:  Diagnosis Date  . Arthritis   . Complication of anesthesia   . Heart murmur   . Hx of adenomatous polyp of colon 08/11/2015  . Hyperlipidemia   . Hypertension   . Hypothyroidism   . Impaired glucose tolerance   . Patella fracture right    2014  . Plantar fascial fibromatosis    left foot  . PONV (postoperative nausea and vomiting)     Past Surgical History:  Procedure Laterality Date  . APPENDECTOMY  1981  . COLON RESECTION  1985   benign tumor  . FOOT SURGERY Left 2003  . patella fracture repair Left 2007  . PLANTAR FASCIA RELEASE Left 02/09/2016   Procedure: LEFT FOOT ENDOSCOPIC PLANTAR FASCIOTOMY;  Surgeon: [name], MD;  Location: [facility];  Service: Orthopedics;  Laterality: Left;  . THYROIDECTOMY  1995  . VAGINAL HYSTERECTOMY  1983    There were no vitals filed for this visit.   Subjective Assessment - 03/31/20 1102    Subjective "I did the best I could. There were some days I couldn't do it."                 ADULT SLP TREATMENT - 03/31/20 1151      General Information   Behavior/Cognition Alert;Cooperative      Treatment Provided   Treatment provided Cognitive-Linquistic      Pain Assessment   Pain Assessment No/denies pain       Cognitive-Linquistic Treatment   Treatment focused on Voice    Skilled Treatment Patient reports doing her home exercises most days. She reports difficulty with pitch glides; despite max cues pt and modeling equating "high" and "low" with effort vs pitch; same for high and low pitch sentences. Made revisions to HEP for focus on loudness only for sentences. To reduce glottal attack with /a/, pt benefitted from using /ha/. Occasional mod cues for abdominal breathing and effort/duration with this, averaged 7.4 seconds. Abdominal breathing in isolation 90% accuracy over 2 minutes; 85% accuracy with sentences with occasional min cues. Reports her voice gets, "cut off" sometimes when she is on the phone. SLP re: abdominal breath for adequate breath support for phonation.      Assessment / Recommendations / Plan   Plan Continue with current plan of care      Progression Toward Goals   Progression toward goals Progressing toward goals            SLP Education - 03/31/20 1151    Education Details revisions to HEP, abdominal breathing    Person(s) Educated Patient    Methods Explanation;Demonstration;Verbal cues;Handout    Comprehension Verbalized understanding;Verbal cues required;Need further instruction  SLP Long Term Goals - 03/31/20 1313      SLP LONG TERM GOAL #1   Title Patient will demonstrate 80% accuracy with abdominal breathing during 5 minutes simple conversation.    Time 4    Period Weeks    Status On-going      SLP LONG TERM GOAL #2   Title Patient will demo HEP for VF adduction with modified independence.    Time 4    Period Weeks    Status On-going            Plan - 03/31/20 1312    Clinical Impression Statement Debra Barry presents today for ST evaluation for vocal fold atrophy, diagnosed by Dr. Blenda Barry in March of 2020. Patient did not receive therapy at that time due to onset of Covid-19 pandemic. Her vocal quality is judged to be WNL throughout  assessment today; patient reports frustration with her ability to sustain and project her voice. Therapist discussed treatment options and recommended schedule of 2x per week for 6 weeks, with potential to decrease frequency or earlier discharge depending on pt progress. At this time patient wishes to commit to 1x per week for 4 weeks. EHP was revised today to simplify some exercises for patient. I recommend brief course of ST to train patient in exercises for vocal fold atrophy to improve pt's vocal endurance for conversations with family, friends, and over the phone.    Speech Therapy Frequency 1x /week    Duration 4 weeks   SLP recommended 2x per week for 6 weeks; 1 wk 4 is pt request   Treatment/Interventions Cueing hierarchy;SLP instruction and feedback;Functional tasks;Patient/family education    Potential to Achieve Goals Fair    Potential Considerations Financial resources;Ability to learn/carryover information    SLP Home Exercise Plan abdominal breathing and PhoRTE exercises given    Consulted and Agree with Plan of Care Patient           Patient will benefit from skilled therapeutic intervention in order to improve the following deficits and impairments:   Other voice and resonance disorders    Problem List Patient Active Problem List   Diagnosis Date Noted  . Hepatic cyst 12/25/2019  . Mild mitral and aortic regurgitation 01/27/2018  . Aortic valve stenosis 01/27/2018  . Sinus bradycardia 01/27/2018  . Paroxysmal atrial tachycardia ([place]) 01/27/2018  . Mixed hyperlipidemia 01/27/2018  . Coronary artery disease involving native coronary artery of native heart without angina pectoris 02/25/2017  . Valvular heart disease 02/25/2017  . Essential hypertension 02/25/2017  . Hypertensive heart disease 09/26/2016  . Hx of adenomatous polyp of colon 08/11/2015  . Family history of colon cancer - father + sister both in 32's 08/11/2015   Debra Barry, [place],  [place] E Jeda Pardue 03/31/2020, 1:14 PM  [facility] [address] [place], [place], [address] Phone: [phone]   Fax:  [phone]   Name: Debra Barry MRN: [id number] Date of Birth: 1951/07/03

## 2020-03-31 NOTE — Patient Instructions (Signed)
Abdominal Breathing exercises  Shoulders down - this is a cue to relax  Place your hand on your abdomen - this helps you focus on easy abdominal breath support - the best and most relaxed way to breathe  Breathe in through your nose and fill your belly with air, watching your hand move outward  Breathe out through your mouth and watch your belly move in. An audible "sh"  may help   Think of your belly as a balloon, when you fill with air (inhale), the balloon gets bigger. As the air goes out (exhale), the balloon deflates.  If you are having difficulty coordinating this, lay on your back with a plastic cup on your belly and repeat the above steps, watching you belly move up with inhalation and down with exhalations  Practice breathing in and out in front of a mirror, watching your belly Breathe in for a count of 5 and breathe out for a count of 5  Always take a full belly breath before you do each repetition of the exercises below:  1.  10 Loud "Haaaaa"  as loud as you can and as long as you can       To help coach you at home with your loud AH!!       YouTube: Speech Therapy Practice - Sustained Phonation /ah/ 10 times        https://www.smith.com/  2. Ha- ahh! Using consistent effort, try to reach your highest pitch while you say "Haa." x10      Lamonte Sakai- ahh! Using consistent effort, try to reach your lowest pitch while you say "Haa." x10 (like a foghorn)   3. Read out loud- Read the sentences on your handout LOUD, effort 8/10. Take a full belly breath before each one.   Use a good belly breath before each exercise- feel your abs contract in as you use your voice  Use a good abdominal breath to power your voice when talking

## 2020-04-07 ENCOUNTER — Other Ambulatory Visit: Payer: Self-pay

## 2020-04-07 ENCOUNTER — Ambulatory Visit: Payer: Medicare HMO | Admitting: Speech Pathology

## 2020-04-07 DIAGNOSIS — R498 Other voice and resonance disorders: Secondary | ICD-10-CM

## 2020-04-07 NOTE — Therapy (Signed)
Debra Barry [address] [place], [place], [address] Phone: [phone]   Fax:  [phone]  Speech Language Pathology Treatment  Patient Details  Name: Debra Barry MRN: [id number] Date of Birth: 26-Jan-1951 Referring Provider (SLP): Dr. Blenda Barry   Encounter Date: 04/07/2020   End of Session - 04/07/20 1331    Visit Number 3    Number of Visits 5    Date for SLP Re-Evaluation 04/19/20    Authorization Type [employer], Prior Auth required    SLP Start Time 1100    SLP Stop Time  1142    SLP Time Calculation (min) 42 min    Activity Tolerance Patient tolerated treatment well           Past Medical History:  Diagnosis Date  . Arthritis   . Complication of anesthesia   . Heart murmur   . Hx of adenomatous polyp of colon 08/11/2015  . Hyperlipidemia   . Hypertension   . Hypothyroidism   . Impaired glucose tolerance   . Patella fracture right    2014  . Plantar fascial fibromatosis    left foot  . PONV (postoperative nausea and vomiting)     Past Surgical History:  Procedure Laterality Date  . APPENDECTOMY  1981  . COLON RESECTION  1985   benign tumor  . FOOT SURGERY Left 2003  . patella fracture repair Left 2007  . PLANTAR FASCIA RELEASE Left 02/09/2016   Procedure: LEFT FOOT ENDOSCOPIC PLANTAR FASCIOTOMY;  Surgeon: Debra Lights, MD;  Location: [facility];  Service: Orthopedics;  Laterality: Left;  . THYROIDECTOMY  1995  . VAGINAL HYSTERECTOMY  1983    There were no vitals filed for this visit.   Subjective Assessment - 04/07/20 1106    Subjective "My mouth is swollen."    Currently in Pain? Yes    Pain Score 6     Pain Location Mouth                 ADULT SLP TREATMENT - 04/07/20 1107      General Information   Behavior/Cognition Alert;Cooperative      Treatment Provided   Treatment provided Cognitive-Linquistic      Cognitive-Linquistic Treatment   Treatment  focused on Voice    Skilled Treatment Pt reports practicing 1x per day most days; she did 2x per day a couple of times. Pt appeared anxious today; was focused on details of exercises which SLP simplified last visit. SLP used modeling cues and positive reinforcement to highlight correct productions. She continues to have difficulty conceptualizing how to raise and lower her pitch (continues to adjust volume vs pitch despite modifications to wording and cues). Therefore, focused session largely on speech tasks with cuing for loudness only. Progressed to sentence level reading tasks with occasional min A required for increased intensity. Reinforced need for regular practice; encouraged pt to read aloud daily 15-20 min 2x per day.       Assessment / Recommendations / Plan   Plan Continue with current plan of care      Progression Toward Goals   Progression toward goals Progressing toward goals   slow progress           SLP Education - 04/07/20 1331    Education Details rationale for frequency of practice 2 x per day    Person(s) Educated Patient    Methods Explanation    Comprehension Verbalized understanding  SLP Long Term Goals - 04/07/20 1333      SLP LONG TERM GOAL #1   Title Patient will demonstrate 80% accuracy with abdominal breathing during 5 minutes simple conversation.    Time 3    Period Weeks    Status On-going      SLP LONG TERM GOAL #2   Title Patient will demo HEP for VF adduction with modified independence.    Time 3    Period Weeks    Status On-going            Plan - 04/07/20 1331    Clinical Impression Statement [name] presents today for ST evaluation for vocal fold atrophy, diagnosed by Dr. Blenda Barry in March of 2020. Patient did not receive therapy at that time due to onset of Covid-19 pandemic. Her vocal quality is judged to be WNL throughout assessment today; patient reports frustration with her ability to sustain and project her voice.  Therapist discussed treatment options and recommended schedule of 2x per week for 6 weeks, with potential to decrease frequency or earlier discharge depending on pt progress. HEP has been simplified due to pt difficulty with procedure for some exercises; will continue to focus on intensity-based cues at this time. I recommend brief course of ST to train patient in exercises for vocal fold atrophy to improve pt's vocal endurance for conversations with family, friends, and over the phone.    Speech Therapy Frequency 1x /week    Duration 4 weeks   SLP recommended 2x per week for 6 weeks; 1 wk 4 is pt request   Treatment/Interventions Cueing hierarchy;SLP instruction and feedback;Functional tasks;Patient/family education    Potential to Achieve Goals Fair    Potential Considerations Financial resources;Ability to learn/carryover information    SLP Home Exercise Plan abdominal breathing and PhoRTE exercises given    Consulted and Agree with Plan of Care Patient           Patient will benefit from skilled therapeutic intervention in order to improve the following deficits and impairments:   Other voice and resonance disorders    Problem List Patient Active Problem List   Diagnosis Date Noted  . Hepatic cyst 12/25/2019  . Mild mitral and aortic regurgitation 01/27/2018  . Aortic valve stenosis 01/27/2018  . Sinus bradycardia 01/27/2018  . Paroxysmal atrial tachycardia ([place]) 01/27/2018  . Mixed hyperlipidemia 01/27/2018  . Coronary artery disease involving native coronary artery of native heart without angina pectoris 02/25/2017  . Valvular heart disease 02/25/2017  . Essential hypertension 02/25/2017  . Hypertensive heart disease 09/26/2016  . Hx of adenomatous polyp of colon 08/11/2015  . Family history of colon cancer - father + sister both in 49's 08/11/2015   Debra Barry, [place], [place] E Debra Barry 04/07/2020, 1:33 PM  [facility] [address] [place], [place], [address] Phone: [phone]   Fax:  [phone]   Name: Debra Barry MRN: [id number] Date of Birth: [birth year]

## 2020-04-14 ENCOUNTER — Ambulatory Visit: Payer: Medicare HMO | Admitting: Speech Pathology

## 2020-04-14 ENCOUNTER — Other Ambulatory Visit: Payer: Self-pay

## 2020-04-14 DIAGNOSIS — R498 Other voice and resonance disorders: Secondary | ICD-10-CM | POA: Diagnosis not present

## 2020-04-14 NOTE — Therapy (Signed)
[facility] [address] [place], [place], [address] Phone: [phone]   Fax:  [phone]  Speech Language Pathology Treatment  Patient Details  Name: Debra Barry MRN: [id number] Date of Birth: 08-05-1951 Referring Provider (SLP): Dr. Blenda Nicely   Encounter Date: 04/14/2020   End of Session - 04/14/20 1333    Visit Number 4    Number of Visits 5    Date for SLP Re-Evaluation 04/19/20    Authorization Type [employer], Prior Auth required    SLP Start Time 1102    SLP Stop Time  4268    SLP Time Calculation (min) 43 min    Activity Tolerance Patient tolerated treatment well           Past Medical History:  Diagnosis Date   Arthritis    Complication of anesthesia    Heart murmur    Hx of adenomatous polyp of colon 08/11/2015   Hyperlipidemia    Hypertension    Hypothyroidism    Impaired glucose tolerance    Patella fracture right    2014   Plantar fascial fibromatosis    left foot   PONV (postoperative nausea and vomiting)     Past Surgical History:  Procedure Laterality Date   [place]   benign tumor   FOOT SURGERY Left 2003   patella fracture repair Left 2007   PLANTAR FASCIA RELEASE Left 02/09/2016   Procedure: LEFT FOOT ENDOSCOPIC PLANTAR FASCIOTOMY;  Surgeon: [name], MD;  Location: [facility];  Service: Orthopedics;  Laterality: Left;   [place]    There were no vitals filed for this visit.   Subjective Assessment - 04/14/20 1104    Subjective "I was able to do it twice on Monday."    Currently in Pain? No/denies                 ADULT SLP TREATMENT - 04/14/20 1104      General Information   Behavior/Cognition Alert;Cooperative      Treatment Provided   Treatment provided Cognitive-Linquistic      Pain Assessment   Pain Assessment No/denies pain       Cognitive-Linquistic Treatment   Treatment focused on Voice    Skilled Treatment Patient demo'd 75% of HEP with modified independence (abdominal breathing, loud /a/); continues to require cues and modeling for pitch glides. Maximum phonation time has improved from 4.9 seconds at eval to 13.25 seconds in session today. Targeted abdominal breathing in sentence level tasks ultimately progressing to simple conversation; pt able to maintain 80% accuracy with AB over 5 minute period.      Assessment / Recommendations / Plan   Plan Discharge SLP treatment due to (comment)   patient request     Progression Toward Goals   Progression toward goals --   goals partially met; pt d/c today at her request           SLP Education - 04/14/20 1332    Education Details Continue HEP, loud reading daily    Person(s) Educated Patient    Methods Explanation;Handout    Comprehension Verbalized understanding              SLP Long Term Goals - 04/14/20 1132      SLP LONG TERM GOAL #1   Title Patient will demonstrate 80% accuracy with abdominal breathing during 5 minutes simple conversation.  Time 2    Period Weeks    Status Achieved      SLP LONG TERM GOAL #2   Title Patient will demo HEP for VF adduction with modified independence.    Time 2    Period Weeks    Status Not Met            Plan - 04/14/20 1333    Clinical Impression Statement Debra Barry was diagnosed with vocal fold atrophy by Dr. Blenda Nicely in March of 2020. Patient did not receive therapy at that time due to onset of Covid-19 pandemic. Her vocal quality remains WNL, although patient has reported difficulties with vocal endurance and that "my voice is closing up." Patient attended 4 sessions of speech therapy including evaluation; 2x per week for 6 weeks was recommended. HEP was simplified due to pt difficulty with procedure for some exercises. She successfully used abdominal breathing today in 5 minute simple conversation; her  MPT has improved from 4.9 to 13.25 seconds. She is discharged today at her request; is advised to continue her modified HEP for vocal fold adduction and read aloud daily.    Speech Therapy Frequency 1x /week    Duration 4 weeks   SLP recommended 2x per week for 6 weeks; 1 wk 4 is pt request   Treatment/Interventions Cueing hierarchy;SLP instruction and feedback;Functional tasks;Patient/family education    Potential to Achieve Goals Fair    Potential Considerations Financial resources;Ability to learn/carryover information    SLP Home Exercise Plan abdominal breathing and PhoRTE exercises given    Consulted and Agree with Plan of Care Patient           Patient will benefit from skilled therapeutic intervention in order to improve the following deficits and impairments:   Other voice and resonance disorders    Problem List Patient Active Problem List   Diagnosis Date Noted   Hepatic cyst 12/25/2019   Mild mitral and aortic regurgitation 01/27/2018   Aortic valve stenosis 01/27/2018   Sinus bradycardia 01/27/2018   Paroxysmal atrial tachycardia (HCC) 01/27/2018   Mixed hyperlipidemia 01/27/2018   Coronary artery disease involving native coronary artery of native heart without angina pectoris 02/25/2017   Valvular heart disease 02/25/2017   Essential hypertension 02/25/2017   Hypertensive heart disease 09/26/2016   Hx of adenomatous polyp of colon 08/11/2015   Family history of colon cancer - father + sister both in 40's 08/11/2015    SPEECH THERAPY DISCHARGE SUMMARY  Visits from Start of Care: 4  Current functional level related to goals / functional outcomes: Pt met 1/2 LTGs. Used AB 80% accuracy in 5 minutes conversation today.   Remaining deficits: Reports decreased vocal endurance; vocal quality remains WNL.    Education / Equipment: HEP for vocal fold atrophy  Plan: Patient agrees to discharge.  Patient goals were partially met. Patient is being  discharged due to the patient's request.  ?????          Deneise Lever, [place], CCC-SLP Speech-Language Pathologist  Debra Barry 04/14/2020, 1:37 PM  [facility] [address] [place], [place], [address] Phone: [phone]   Fax:  [phone]   Name: Debra Barry MRN: [id number] Date of Birth: Jun 21, 1951

## 2020-05-13 DIAGNOSIS — L258 Unspecified contact dermatitis due to other agents: Secondary | ICD-10-CM | POA: Diagnosis not present

## 2020-05-25 ENCOUNTER — Other Ambulatory Visit: Payer: Self-pay | Admitting: Internal Medicine

## 2020-06-06 DIAGNOSIS — I10 Essential (primary) hypertension: Secondary | ICD-10-CM | POA: Diagnosis not present

## 2020-06-06 DIAGNOSIS — I2510 Atherosclerotic heart disease of native coronary artery without angina pectoris: Secondary | ICD-10-CM | POA: Diagnosis not present

## 2020-06-06 DIAGNOSIS — N183 Chronic kidney disease, stage 3 unspecified: Secondary | ICD-10-CM | POA: Diagnosis not present

## 2020-06-06 DIAGNOSIS — E890 Postprocedural hypothyroidism: Secondary | ICD-10-CM | POA: Diagnosis not present

## 2020-06-06 DIAGNOSIS — R7303 Prediabetes: Secondary | ICD-10-CM | POA: Diagnosis not present

## 2020-06-06 DIAGNOSIS — E782 Mixed hyperlipidemia: Secondary | ICD-10-CM | POA: Diagnosis not present

## 2020-06-20 DIAGNOSIS — Z9181 History of falling: Secondary | ICD-10-CM | POA: Diagnosis not present

## 2020-06-20 DIAGNOSIS — E785 Hyperlipidemia, unspecified: Secondary | ICD-10-CM | POA: Diagnosis not present

## 2020-06-20 DIAGNOSIS — Z0000 Encounter for general adult medical examination without abnormal findings: Secondary | ICD-10-CM | POA: Diagnosis not present

## 2020-06-20 DIAGNOSIS — Z1331 Encounter for screening for depression: Secondary | ICD-10-CM | POA: Diagnosis not present

## 2020-06-29 ENCOUNTER — Encounter: Payer: Self-pay | Admitting: Internal Medicine

## 2020-06-29 ENCOUNTER — Other Ambulatory Visit: Payer: Self-pay

## 2020-06-29 ENCOUNTER — Ambulatory Visit (INDEPENDENT_AMBULATORY_CARE_PROVIDER_SITE_OTHER): Payer: Medicare HMO | Admitting: Internal Medicine

## 2020-06-29 VITALS — BP 160/70 | HR 53 | Ht 64.0 in | Wt 142.2 lb

## 2020-06-29 DIAGNOSIS — I25118 Atherosclerotic heart disease of native coronary artery with other forms of angina pectoris: Secondary | ICD-10-CM | POA: Diagnosis not present

## 2020-06-29 DIAGNOSIS — I10 Essential (primary) hypertension: Secondary | ICD-10-CM | POA: Diagnosis not present

## 2020-06-29 DIAGNOSIS — I471 Supraventricular tachycardia: Secondary | ICD-10-CM | POA: Diagnosis not present

## 2020-06-29 DIAGNOSIS — E782 Mixed hyperlipidemia: Secondary | ICD-10-CM | POA: Diagnosis not present

## 2020-06-29 DIAGNOSIS — I38 Endocarditis, valve unspecified: Secondary | ICD-10-CM

## 2020-06-29 MED ORDER — ISOSORBIDE MONONITRATE ER 30 MG PO TB24: 15.0000 mg | ORAL_TABLET | Freq: Every day | ORAL | 2 refills | Status: DC

## 2020-06-29 NOTE — Patient Instructions (Signed)
Medication Instructions:  Your physician has recommended you make the following change in your medication:  1- START Imdur 15 mg (0.5 tablet) by mouth once a day.  *If you need a refill on your cardiac medications before your next appointment, please call your pharmacy*  Lab Work: Worthy Keeler will be requested from your primary care physician.  If you have labs (blood work) drawn today and your tests are completely normal, you will receive your results only by:  [place] (if you have MyChart) OR  A paper copy in the mail If you have any lab test that is abnormal or we need to change your treatment, we will call you to review the results.  Testing/Procedures: none  Follow-Up: At [facility], you and your health needs are our priority.  As part of our continuing mission to provide you with exceptional heart care, we have created designated Provider Care Teams.  These Care Teams include your primary Cardiologist (physician) and Advanced Practice Providers (APPs -  Physician Assistants and Nurse Practitioners) who all work together to provide you with the care you need, when you need it.  We recommend signing up for the patient portal called "MyChart".  Sign up information is provided on this After Visit Summary.  MyChart is used to connect with patients for Virtual Visits (Telemedicine).  Patients are able to view lab/test results, encounter notes, upcoming appointments, etc.  Non-urgent messages can be sent to your provider as well.   To learn more about what you can do with MyChart, go to [email or web address].    Your next appointment:   3 month(s)  The format for your next appointment:   In Person  Provider:    You may see Nelva Bush, MD or one of the following Advanced Practice Providers on your designated Care Team:    Murray Hodgkins, NP  Christell Faith, PA-C  Marrianne Mood, PA-C

## 2020-06-29 NOTE — Progress Notes (Signed)
Follow-up Outpatient Visit Date: 06/29/2020  Primary Care Provider: [facility] [id number]  Chief Complaint: Follow-up CAD, valvular heart disease, and hypertension  HPI:  Ms. Essman is a 69 y.o. female with history of coronary artery disease (medically managed), aortic and mitral regurgitation, paroxysmal atrial tachycardia, hypertension, hyperlipidemia, impaired glucose tolerance, and hypothyroidism, who presents for follow-up of shortness of breath and valvular heart disease.  We last spoke via virtual visit in March, at which time Ms. Blasius continued to note some exertional dyspnea that was unchanged from prior visits.  Blood pressure was elevated above baseline, which she attributed to stress.  We did not make any medication changes.  Today, Ms. Schuneman reports feeling about the same as when we last spoke.  She has stable dyspnea on exertion without chest pain.  She endorses orthopnea but is able to sleep on her side using 1 pillow.  She reports occasional dependent edema in her feet.  She is no longer on HCTZ, as this caused a rash.  Her home blood pressures are usually in the 409'W systolic; Ms. Koerner states that she does not feel well if her blood pressure drops below this.  She has not experienced any palpitations or lightheadedness.  Her sister is no residing in a nursing home after developing a gangrenous toe and ultimately undergoing BKA.  This has alleviates some of the stress in Ms. Borel's life, as she was previously the primary caregiver for her chronically ill sister.  Ms. Barman reports having had labs done through her PCP within the last few weeks.  --------------------------------------------------------------------------------------------------  Past Medical History:  Diagnosis Date  . Arthritis   . Complication of anesthesia   . Heart murmur   . Hx of adenomatous polyp of colon 08/11/2015  . Hyperlipidemia   .  Hypertension   . Hypothyroidism   . Impaired glucose tolerance   . Patella fracture right    2014  . Plantar fascial fibromatosis    left foot  . PONV (postoperative nausea and vomiting)    Past Surgical History:  Procedure Laterality Date  . APPENDECTOMY  1981  . COLON RESECTION  1985   benign tumor  . FOOT SURGERY Left 2003  . patella fracture repair Left 2007  . PLANTAR FASCIA RELEASE Left 02/09/2016   Procedure: LEFT FOOT ENDOSCOPIC PLANTAR FASCIOTOMY;  Surgeon: [name], MD;  Location: [facility];  Service: Orthopedics;  Laterality: Left;  . THYROIDECTOMY  1995  . VAGINAL HYSTERECTOMY  1983    Current Meds  Medication Sig  . amLODipine (NORVASC) 10 MG tablet Take 10 mg by mouth daily.  Marland Kitchen aspirin EC 81 MG tablet Take 81 mg by mouth daily.  . cyclobenzaprine (FLEXERIL) 10 MG tablet Take 10 mg by mouth daily as needed.  Marland Kitchen levothyroxine (SYNTHROID, LEVOTHROID) 75 MCG tablet Take 75 mcg by mouth daily before breakfast.   . multivitamin-lutein (OCUVITE-LUTEIN) CAPS capsule Take 1 capsule by mouth daily.  . Omega-3 Fatty Acids (FISH OIL) 1000 MG CPDR Take 2 capsules by mouth 2 (two) times daily.  . Polyvinyl Alcohol-Povidone (REFRESH OP) Apply 1 drop to eye 4 (four) times daily as needed (dry eyes).   . Probiotic Product (PROBIOTIC-10 PO) Take 1 capsule by mouth daily.  Marland Kitchen REPATHA SURECLICK 119 MG/ML SOAJ INJECT 1 PEN  SUBCUTANEOUSLY INTO THE SKIN EVERY 14 DAYS  . telmisartan (MICARDIS) 80 MG tablet Take 80 mg by mouth daily.  . Tetrahydrozoline HCl (  EYE DROPS OP) Place 1 drop into both eyes at bedtime as needed. Refresh eye gel    Allergies: Citric acid, Codeine, Penicillins, Statins, Tricor [fenofibrate], Verapamil, and Sulfa antibiotics  Social History   Tobacco Use  . Smoking status: Former Smoker    Packs/day: 0.25    Types: Cigarettes    Quit date: 10/22/1974    Years since quitting: 45.7  . Smokeless tobacco: Never Used  Vaping Use  . Vaping  Use: Never used  Substance Use Topics  . Alcohol use: No    Alcohol/week: 0.0 standard drinks  . Drug use: No    Family History  Problem Relation Age of Onset  . Rectal cancer Father   . Hepatitis Father   . Hypertension Mother   . Hyperlipidemia Mother   . Glaucoma Mother   . Stroke Mother        TIA  . Heart disease Maternal Grandfather   . Colon cancer Neg Hx     Review of Systems: A 12-system review of systems was performed and was negative except as noted in the HPI.  --------------------------------------------------------------------------------------------------  Physical Exam: BP (!) 160/70 (BP Location: Left Arm, Patient Position: Sitting, Cuff Size: Normal)   Pulse (!) 53   Ht 5\' 4"  (1.626 m)   Wt 142 lb 4 oz (64.5 kg)   LMP  (LMP Unknown)   SpO2 99%   BMI 24.42 kg/m   General:  NAD. HEENT: No conjunctival pallor or scleral icterus. Facemask in place. Neck: Supple without lymphadenopathy, thyromegaly, JVD, or HJR. Lungs: Normal work of breathing. Clear to auscultation bilaterally without wheezes or crackles. Heart: Bradycardic but regular with 2/6 systolic and 1/6 early diastolic murmurs.  No rubs or gallops. Abd: Bowel sounds present. Soft, NT/ND without hepatosplenomegaly Ext: No lower extremity edema.  EKG:  Sinus bradycardia with PAC's, left axis deviation, and borderline LVH.  PAC's are new since 05/27/2019.  Otherwise, there has been no significant interval change.  Lab Results  Component Value Date   WBC 3.2 (L) 09/27/2016   HGB 10.2 (L) 09/27/2016   HCT 31.6 (L) 09/27/2016   MCV 94.0 09/27/2016   PLT 183 09/27/2016    Lab Results  Component Value Date   NA 138 09/27/2016   K 3.8 09/27/2016   CL 105 09/27/2016   CO2 24 09/27/2016   BUN 43 (H) 09/27/2016   CREATININE 1.91 (H) 09/27/2016   GLUCOSE 89 09/27/2016   ALT 17 07/21/2018    Lab Results  Component Value Date   CHOL 143 07/21/2018   HDL 38 (L) 07/21/2018   LDLCALC 63  07/21/2018   TRIG 209 (H) 07/21/2018   CHOLHDL 3.8 07/21/2018    --------------------------------------------------------------------------------------------------  ASSESSMENT AND PLAN: CAD with stable angina: Other than mild exertional dyspnea that has remained stable, Ms. Ekblad is not symptomatic.  Prior MPI showed small mid anterior defect.  Given elevated blood pressure, we have agreed to a trial of isosorbide mononitrate 15 mg daily to see if this will help improve her dyspnea and blood pressure.  Valvular heart disease: Ms. Wirsing appears euvolemic with stable NYHA class II symptoms.  Echo last year showed mild mitral and aortic regurgitation.  We will continue to work on blood pressure control.  No further intervention is planned at this time.  PAT: No palpitations reported.  Given bradycardia at baseline, we will defer addition of beta-blocker and nondihydropyridine calcium channel blocker.  Hypertension: BP moderately elevated today and also above goal at home.  HCTZ has been stopped since our last visit due to associated rash.  We have agreed to add isosorbide mononitrate 15 mg daily.  Amlodipine and telmesartan will be continued.  I will request recent outside labs from her PCP, given history of CKD.  Hyperlipidemia: Continue Repatha.  I will request recent labs from PCP to ensure that lipids are at goal.  Follow-up: Return to clinic in 3 months.  Nelva Bush, MD 06/29/2020 9:38 AM

## 2020-06-30 ENCOUNTER — Telehealth: Payer: Self-pay | Admitting: Internal Medicine

## 2020-06-30 ENCOUNTER — Encounter: Payer: Self-pay | Admitting: Internal Medicine

## 2020-06-30 NOTE — Telephone Encounter (Signed)
Called patient and discussed Imdur according to Dr Darnelle Bos office visit note. She was concerned about nitrates in foods and I said these were a different type of nitrate.  She felt better after the explanation and will start the Imdur.

## 2020-06-30 NOTE — Telephone Encounter (Signed)
Pt c/o medication issue:  1. Name of Medication: isosorbide  2. How are you currently taking this medication (dosage and times per day)? 15 mg po q d   3. Are you having a reaction (difficulty breathing--STAT)? No   4. What is your medication issue? Patient researched drug and states it is for angina and CAD and contains nitrates   Patient doesn't know how this is going to help with BP and is concerned this will be too many nitrates in her body

## 2020-07-13 DIAGNOSIS — R300 Dysuria: Secondary | ICD-10-CM | POA: Diagnosis not present

## 2020-07-13 DIAGNOSIS — N949 Unspecified condition associated with female genital organs and menstrual cycle: Secondary | ICD-10-CM | POA: Diagnosis not present

## 2020-07-22 ENCOUNTER — Other Ambulatory Visit: Payer: Self-pay | Admitting: Internal Medicine

## 2020-08-01 ENCOUNTER — Telehealth: Payer: Self-pay | Admitting: *Deleted

## 2020-08-01 NOTE — Telephone Encounter (Signed)
Patient notified and verbalized understanding of the recommendations from the lab work.  She was appreciative.

## 2020-08-01 NOTE — Telephone Encounter (Signed)
-----   Message from Nelva Bush, MD sent at 07/29/2020  7:11 AM EDT ----- Labs reviewed.  LDL is still a little above goal.  Ms. Borys should continue her current medications and work on lifestyle modifications.  Nelva Bush, MD [facility]  ----- Message ----- From: Annia Belt, RN Sent: 07/26/2020  10:55 AM EDT To: Nelva Bush, MD  Labs have been scanned in for you to review when able.

## 2020-08-03 DIAGNOSIS — H2513 Age-related nuclear cataract, bilateral: Secondary | ICD-10-CM | POA: Diagnosis not present

## 2020-08-03 DIAGNOSIS — H10413 Chronic giant papillary conjunctivitis, bilateral: Secondary | ICD-10-CM | POA: Diagnosis not present

## 2020-08-03 DIAGNOSIS — H43813 Vitreous degeneration, bilateral: Secondary | ICD-10-CM | POA: Diagnosis not present

## 2020-09-28 ENCOUNTER — Ambulatory Visit (INDEPENDENT_AMBULATORY_CARE_PROVIDER_SITE_OTHER): Payer: Medicare HMO | Admitting: Internal Medicine

## 2020-09-28 ENCOUNTER — Encounter: Payer: Self-pay | Admitting: Internal Medicine

## 2020-09-28 ENCOUNTER — Other Ambulatory Visit: Payer: Self-pay

## 2020-09-28 VITALS — BP 152/62 | HR 54 | Ht 64.0 in | Wt 141.0 lb

## 2020-09-28 DIAGNOSIS — I25118 Atherosclerotic heart disease of native coronary artery with other forms of angina pectoris: Secondary | ICD-10-CM | POA: Diagnosis not present

## 2020-09-28 DIAGNOSIS — I38 Endocarditis, valve unspecified: Secondary | ICD-10-CM

## 2020-09-28 DIAGNOSIS — E782 Mixed hyperlipidemia: Secondary | ICD-10-CM | POA: Diagnosis not present

## 2020-09-28 DIAGNOSIS — I10 Essential (primary) hypertension: Secondary | ICD-10-CM | POA: Diagnosis not present

## 2020-09-28 MED ORDER — ISOSORBIDE MONONITRATE ER 30 MG PO TB24: 30.0000 mg | ORAL_TABLET | Freq: Every day | ORAL | 2 refills | Status: DC

## 2020-09-28 NOTE — Patient Instructions (Signed)
Medication Instructions:  Your physician has recommended you make the following change in your medication:  1- INCREASE Imdur to 30 mg (1 tablet) by mouth once a day.  *If you need a refill on your cardiac medications before your next appointment, please call your pharmacy*  Follow-Up: At [facility], you and your health needs are our priority.  As part of our continuing mission to provide you with exceptional heart care, we have created designated Provider Care Teams.  These Care Teams include your primary Cardiologist (physician) and Advanced Practice Providers (APPs -  Physician Assistants and Nurse Practitioners) who all work together to provide you with the care you need, when you need it.  We recommend signing up for the patient portal called "MyChart".  Sign up information is provided on this After Visit Summary.  MyChart is used to connect with patients for Virtual Visits (Telemedicine).  Patients are able to view lab/test results, encounter notes, upcoming appointments, etc.  Non-urgent messages can be sent to your provider as well.   To learn more about what you can do with MyChart, go to [email or web address].    Your next appointment:   6 month(s)  The format for your next appointment:   In Person  Provider:   You may see Nelva Bush, MD or one of the following Advanced Practice Providers on your designated Care Team:    Murray Hodgkins, NP  Christell Faith, PA-C  Marrianne Mood, PA-C  Cadence [place], [place]  Laurann [place], NP

## 2020-09-28 NOTE — Progress Notes (Signed)
Follow-up Outpatient Visit Date: 09/28/2020  Primary Care Provider: Practice, [place] [phone]  Chief Complaint: Follow-up hypertension, coronary artery disease, and valvular heart disease  HPI:  Ms. Cordoba is a 69 y.o. female with history of coronary artery disease (medically managed), aortic and mitral regurgitation, paroxysmal atrial tachycardia, hypertension, hyperlipidemia, impaired glucose tolerance, and hypothyroidism, who presents for follow-up of hypertension, coronary artery disease, and valvular heart disease.  I last saw her in early September, a which time she reported stable exertional dyspnea.  Home blood pressure were usually in the 675'F systolic, with HCTZ having been stopped since our prior visit due to a rash.  We agreed to add isosorbide mononitrate for blood pressure control and antianginal therapy.  Today, Ms. Joos reports that she is feeling relatively well though she has been under a lot of stress lately.  Since her last visit, her sister returned home and subsequently fell.  She is now back in assisted living.  Ms. Bobst notes that her blood pressure has been "up-and-down" though most systolic readings are in the 140s and 150s.  She has been tolerating low-dose isosorbide mononitrate well after experiencing headaches for the first few days.  She denies chest pain, palpitations, lightheadedness, and edema.  She has stable shortness of breath when she lies flat on her back but is able to lie flat on her side without difficulty.  --------------------------------------------------------------------------------------------------  Past Medical History:  Diagnosis Date  . Arthritis   . Complication of anesthesia   . Heart murmur   . Hx of adenomatous polyp of colon 08/11/2015  . Hyperlipidemia   . Hypertension   . Hypothyroidism   . Impaired glucose tolerance   . Patella fracture right    2014  . Plantar fascial  fibromatosis    left foot  . PONV (postoperative nausea and vomiting)    Past Surgical History:  Procedure Laterality Date  . APPENDECTOMY  1981  . COLON RESECTION  1985   benign tumor  . FOOT SURGERY Left 2003  . patella fracture repair Left 2007  . PLANTAR FASCIA RELEASE Left 02/09/2016   Procedure: LEFT FOOT ENDOSCOPIC PLANTAR FASCIOTOMY;  Surgeon: [name], MD;  Location: [facility];  Service: Orthopedics;  Laterality: Left;  . THYROIDECTOMY  1995  . VAGINAL HYSTERECTOMY  1983    Current Meds  Medication Sig  . amLODipine (NORVASC) 10 MG tablet Take 10 mg by mouth daily.  Marland Kitchen aspirin EC 81 MG tablet Take 81 mg by mouth daily.  . cyclobenzaprine (FLEXERIL) 10 MG tablet Take 10 mg by mouth daily as needed.  . isosorbide mononitrate (IMDUR) 30 MG 24 hr tablet Take 0.5 tablets (15 mg total) by mouth daily.  Marland Kitchen levothyroxine (SYNTHROID, LEVOTHROID) 75 MCG tablet Take 75 mcg by mouth daily before breakfast.   . multivitamin-lutein (OCUVITE-LUTEIN) CAPS capsule Take 1 capsule by mouth daily.  . Omega-3 Fatty Acids (FISH OIL) 1000 MG CPDR Take 2 capsules by mouth 2 (two) times daily.  . Polyvinyl Alcohol-Povidone (REFRESH OP) Apply 1 drop to eye 4 (four) times daily as needed (dry eyes).   Marland Kitchen REPATHA SURECLICK 163 MG/ML SOAJ INJECT ONE PEN SUBCUTANEOUSLY INTO THE SKIN EVERY 14 DAYS  . telmisartan (MICARDIS) 80 MG tablet Take 80 mg by mouth daily.  . Tetrahydrozoline HCl (EYE DROPS OP) Place 1 drop into both eyes at bedtime as needed. Refresh eye gel    Allergies: Citric acid, Codeine, Penicillins, Statins, Tricor [  fenofibrate], Verapamil, and Sulfa antibiotics  Social History   Tobacco Use  . Smoking status: Former Smoker    Packs/day: 0.25    Types: Cigarettes    Quit date: 10/22/1974    Years since quitting: 45.9  . Smokeless tobacco: Never Used  Vaping Use  . Vaping Use: Never used  Substance Use Topics  . Alcohol use: No    Alcohol/week: 0.0 standard  drinks  . Drug use: No    Family History  Problem Relation Age of Onset  . Rectal cancer Father   . Hepatitis Father   . Hypertension Mother   . Hyperlipidemia Mother   . Glaucoma Mother   . Stroke Mother        TIA  . Heart disease Maternal Grandfather   . Colon cancer Neg Hx     Review of Systems: A 12-system review of systems was performed and was negative except as noted in the HPI.  --------------------------------------------------------------------------------------------------  Physical Exam: BP (!) 152/62 (BP Location: Left Arm, Patient Position: Sitting, Cuff Size: Normal)   Pulse (!) 54   Ht 5\' 4"  (1.626 m)   Wt 141 lb (64 kg)   LMP  (LMP Unknown)   SpO2 97%   BMI 24.20 kg/m   General: NAD. Neck: No JVD or HJR. Lungs: Clear to auscultation bilaterally without wheezes or crackles. Heart: Bradycardic but regular with 3/6 systolic murmur.  No rubs or gallops. Abdomen: Soft, nontender, nondistended. Extremities: No lower extremity edema.  EKG:  Sinus bradycardia with left axis deviation and borderline LVH.  No significant change from prior tracing on 06/29/2020.   Lab Results  Component Value Date   WBC 3.2 (L) 09/27/2016   HGB 10.2 (L) 09/27/2016   HCT 31.6 (L) 09/27/2016   MCV 94.0 09/27/2016   PLT 183 09/27/2016    Lab Results  Component Value Date   NA 138 09/27/2016   K 3.8 09/27/2016   CL 105 09/27/2016   CO2 24 09/27/2016   BUN 43 (H) 09/27/2016   CREATININE 1.91 (H) 09/27/2016   GLUCOSE 89 09/27/2016   ALT 17 07/21/2018    Lab Results  Component Value Date   CHOL 143 07/21/2018   HDL 38 (L) 07/21/2018   LDLCALC 63 07/21/2018   TRIG 209 (H) 07/21/2018   CHOLHDL 3.8 07/21/2018    --------------------------------------------------------------------------------------------------  ASSESSMENT AND PLAN: Coronary artery disease with stable angina: Ms. Dismuke denies significant chest pain since our last visit.  Prior MPI in 2017 was  notable for a small mid anterior defect.  Given persistently elevated blood pressure, we have agreed to increase her isosorbide mononitrate to 30 mg daily.  We will continue amlodipine 10 mg daily as well.  Beta-blockers precluded by baseline sinus bradycardia.  Valvular heart disease: Ms. Vanwagner does not have signs or symptoms of heart failure.  Stable 3/6 systolic murmur noted on examination today.  Echocardiogram in 12/2019 was stable.  We will continue to work on blood pressure control, as outlined above.  Hypertension: Blood pressure remains suboptimally controlled today.  We will increase isosorbide mononitrate to 30 mg daily.  Current doses of amlodipine and telmisartan will be continued.  Hyperlipidemia: LDL mildly elevated on last check by PCP earlier this year.  Continue Repatha and fish oil in the setting of CAD and history of statin intolerance.  Follow-up: Return to clinic in 6 months.  Nelva Bush, MD 09/28/2020 10:04 AM

## 2020-10-14 ENCOUNTER — Other Ambulatory Visit: Payer: Self-pay | Admitting: Internal Medicine

## 2020-10-25 ENCOUNTER — Telehealth: Payer: Self-pay

## 2020-10-25 NOTE — Telephone Encounter (Signed)
Incoming fax from [place].  Repatha is approved until 10/21/2021

## 2020-11-14 ENCOUNTER — Telehealth: Payer: Self-pay

## 2020-11-14 NOTE — Telephone Encounter (Addendum)
Prior authorization sent through [email or web address]  Debra Barry (Key: [id number]) Rx #: [id number] Repatha SureClick 140MG /ML auto-injectors Waiting for approval.   Approved  uthorization already on file for this request.Authorization starting on 10/22/2020 and ending on 10/21/2021.

## 2020-11-15 NOTE — Telephone Encounter (Signed)
Debra Barry (Key: Corinna Capra) Rx #: [id number] Repatha SureClick 140MG /ML auto-injectors   Form [employer] Electronic PA Form  Determination Favorable 22 days ago  Message from Plan PA Case: [id number], Status: Approved, Coverage Starts on: 10/22/2020 12:00:00 AM, Coverage Ends on: 10/21/2021 12:00:00 AM.   Questions? Contact [phone].

## 2020-11-21 ENCOUNTER — Encounter: Payer: Self-pay | Admitting: Internal Medicine

## 2020-12-02 DIAGNOSIS — J101 Influenza due to other identified influenza virus with other respiratory manifestations: Secondary | ICD-10-CM | POA: Diagnosis not present

## 2020-12-02 DIAGNOSIS — Z20822 Contact with and (suspected) exposure to covid-19: Secondary | ICD-10-CM | POA: Diagnosis not present

## 2020-12-09 DIAGNOSIS — E782 Mixed hyperlipidemia: Secondary | ICD-10-CM | POA: Diagnosis not present

## 2020-12-09 DIAGNOSIS — E890 Postprocedural hypothyroidism: Secondary | ICD-10-CM | POA: Diagnosis not present

## 2020-12-09 DIAGNOSIS — Z6822 Body mass index (BMI) 22.0-22.9, adult: Secondary | ICD-10-CM | POA: Diagnosis not present

## 2020-12-09 DIAGNOSIS — N183 Chronic kidney disease, stage 3 unspecified: Secondary | ICD-10-CM | POA: Diagnosis not present

## 2020-12-09 DIAGNOSIS — M810 Age-related osteoporosis without current pathological fracture: Secondary | ICD-10-CM | POA: Diagnosis not present

## 2020-12-09 DIAGNOSIS — R7303 Prediabetes: Secondary | ICD-10-CM | POA: Diagnosis not present

## 2020-12-09 DIAGNOSIS — I2510 Atherosclerotic heart disease of native coronary artery without angina pectoris: Secondary | ICD-10-CM | POA: Diagnosis not present

## 2020-12-09 DIAGNOSIS — I10 Essential (primary) hypertension: Secondary | ICD-10-CM | POA: Diagnosis not present

## 2021-01-02 DIAGNOSIS — Z1231 Encounter for screening mammogram for malignant neoplasm of breast: Secondary | ICD-10-CM | POA: Diagnosis not present

## 2021-01-30 ENCOUNTER — Other Ambulatory Visit: Payer: Self-pay | Admitting: Internal Medicine

## 2021-03-01 ENCOUNTER — Other Ambulatory Visit: Payer: Self-pay | Admitting: Internal Medicine

## 2021-03-03 DIAGNOSIS — L0202 Furuncle of face: Secondary | ICD-10-CM | POA: Diagnosis not present

## 2021-03-06 ENCOUNTER — Other Ambulatory Visit: Payer: Self-pay

## 2021-03-06 ENCOUNTER — Emergency Department (HOSPITAL_COMMUNITY): Payer: Medicare HMO

## 2021-03-06 ENCOUNTER — Encounter (HOSPITAL_COMMUNITY): Payer: Self-pay

## 2021-03-06 ENCOUNTER — Emergency Department (HOSPITAL_COMMUNITY)
Admission: EM | Admit: 2021-03-06 | Discharge: 2021-03-06 | Disposition: A | Discharge status: Home / Self Care | Payer: Medicare HMO | Attending: Emergency Medicine | Admitting: Emergency Medicine

## 2021-03-06 DIAGNOSIS — I119 Hypertensive heart disease without heart failure: Secondary | ICD-10-CM | POA: Insufficient documentation

## 2021-03-06 DIAGNOSIS — I6529 Occlusion and stenosis of unspecified carotid artery: Secondary | ICD-10-CM | POA: Diagnosis not present

## 2021-03-06 DIAGNOSIS — E039 Hypothyroidism, unspecified: Secondary | ICD-10-CM | POA: Diagnosis not present

## 2021-03-06 DIAGNOSIS — Z79899 Other long term (current) drug therapy: Secondary | ICD-10-CM | POA: Diagnosis not present

## 2021-03-06 DIAGNOSIS — S1093XA Contusion of unspecified part of neck, initial encounter: Secondary | ICD-10-CM | POA: Diagnosis not present

## 2021-03-06 DIAGNOSIS — I25119 Atherosclerotic heart disease of native coronary artery with unspecified angina pectoris: Secondary | ICD-10-CM | POA: Diagnosis not present

## 2021-03-06 DIAGNOSIS — Z043 Encounter for examination and observation following other accident: Secondary | ICD-10-CM | POA: Diagnosis not present

## 2021-03-06 DIAGNOSIS — W108XXA Fall (on) (from) other stairs and steps, initial encounter: Secondary | ICD-10-CM | POA: Diagnosis not present

## 2021-03-06 DIAGNOSIS — M47812 Spondylosis without myelopathy or radiculopathy, cervical region: Secondary | ICD-10-CM | POA: Diagnosis not present

## 2021-03-06 DIAGNOSIS — M25512 Pain in left shoulder: Secondary | ICD-10-CM | POA: Insufficient documentation

## 2021-03-06 DIAGNOSIS — M542 Cervicalgia: Secondary | ICD-10-CM | POA: Diagnosis not present

## 2021-03-06 DIAGNOSIS — Z7982 Long term (current) use of aspirin: Secondary | ICD-10-CM | POA: Diagnosis not present

## 2021-03-06 DIAGNOSIS — M4802 Spinal stenosis, cervical region: Secondary | ICD-10-CM | POA: Diagnosis not present

## 2021-03-06 DIAGNOSIS — M19012 Primary osteoarthritis, left shoulder: Secondary | ICD-10-CM | POA: Diagnosis not present

## 2021-03-06 DIAGNOSIS — S0990XA Unspecified injury of head, initial encounter: Secondary | ICD-10-CM | POA: Diagnosis not present

## 2021-03-06 DIAGNOSIS — S20221A Contusion of right back wall of thorax, initial encounter: Secondary | ICD-10-CM | POA: Diagnosis not present

## 2021-03-06 DIAGNOSIS — T07XXXA Unspecified multiple injuries, initial encounter: Secondary | ICD-10-CM

## 2021-03-06 DIAGNOSIS — Z87891 Personal history of nicotine dependence: Secondary | ICD-10-CM | POA: Diagnosis not present

## 2021-03-06 NOTE — ED Triage Notes (Signed)
Pt slipped and fell down 6 stairs, denies loc, states she hit her head but denies headache- this occurred at 1430. Pt c/o rt sided back pain and left shoulder pain.

## 2021-03-06 NOTE — ED Provider Notes (Signed)
[facility] DEPT Provider Note   CSN: [id number] Arrival date & time: 03/06/21  1604     History Chief Complaint  Patient presents with  . Fall    Debra Barry is a 70 y.o. female.  70 year old female presents with mechanical fall just prior to arrival.  Patient states that she fell down 6 steps and struck her head but did not have any LOC.  Complains of some mild mid neck pain but denies any distal numbness or tingling in her arms.  Also has some right costal margin pain.  Denies any dyspnea.  Also noted some left shoulder discomfort but denies any weakness to her left hand.  No abdominal discomfort.  Denies any pain below the waist.  No treatment use prior to arrival        Past Medical History:  Diagnosis Date  . Arthritis   . Complication of anesthesia   . Heart murmur   . Hx of adenomatous polyp of colon 08/11/2015  . Hyperlipidemia   . Hypertension   . Hypothyroidism   . Impaired glucose tolerance   . Patella fracture right    2014  . Plantar fascial fibromatosis    left foot  . PONV (postoperative nausea and vomiting)     Patient Active Problem List   Diagnosis Date Noted  . Hepatic cyst 12/25/2019  . Mild mitral and aortic regurgitation 01/27/2018  . Aortic valve stenosis 01/27/2018  . Sinus bradycardia 01/27/2018  . Paroxysmal atrial tachycardia ([place]) 01/27/2018  . Mixed hyperlipidemia 01/27/2018  . Coronary artery disease of native artery of native heart with stable angina pectoris ([place]) 02/25/2017  . Valvular heart disease 02/25/2017  . Essential hypertension 02/25/2017  . Hypertensive heart disease 09/26/2016  . Hx of adenomatous polyp of colon 08/11/2015  . Family history of colon cancer - father + sister both in 69's 08/11/2015    Past Surgical History:  Procedure Laterality Date  . APPENDECTOMY  1981  . COLON RESECTION  1985   benign tumor  . FOOT SURGERY Left 2003  . patella fracture repair Left 2007  .  PLANTAR FASCIA RELEASE Left 02/09/2016   Procedure: LEFT FOOT ENDOSCOPIC PLANTAR FASCIOTOMY;  Surgeon: [name], MD;  Location: [facility];  Service: Orthopedics;  Laterality: Left;  . THYROIDECTOMY  1995  . VAGINAL HYSTERECTOMY  1983     OB History   No obstetric history on file.     Family History  Problem Relation Age of Onset  . Rectal cancer Father   . Hepatitis Father   . Hypertension Mother   . Hyperlipidemia Mother   . Glaucoma Mother   . Stroke Mother        TIA  . Heart disease Maternal Grandfather   . Colon cancer Neg Hx     Social History   Tobacco Use  . Smoking status: Former Smoker    Packs/day: 0.25    Types: Cigarettes    Quit date: 10/22/1974    Years since quitting: 46.4  . Smokeless tobacco: Never Used  Vaping Use  . Vaping Use: Never used  Substance Use Topics  . Alcohol use: No    Alcohol/week: 0.0 standard drinks  . Drug use: No    Home Medications Prior to Admission medications   Medication Sig Start Date End Date Taking? Authorizing Provider  amLODipine (NORVASC) 10 MG tablet Take 10 mg by mouth daily.    [provider]  aspirin EC 81  MG tablet Take 81 mg by mouth daily.    [provider]  cyclobenzaprine (FLEXERIL) 10 MG tablet Take 10 mg by mouth daily as needed. 02/05/17   [provider]  isosorbide mononitrate (IMDUR) 30 MG 24 hr tablet Take 1 tablet (30 mg total) by mouth daily. 09/28/20   End, Harrell Gave, MD  levothyroxine (SYNTHROID, LEVOTHROID) 75 MCG tablet Take 75 mcg by mouth daily before breakfast.  12/16/17   [provider]  multivitamin-lutein (OCUVITE-LUTEIN) CAPS capsule Take 1 capsule by mouth daily.    [provider]  Omega-3 Fatty Acids (FISH OIL) 1000 MG CPDR Take 2 capsules by mouth 2 (two) times daily. 03/06/18   End, Harrell Gave, MD  Polyvinyl Alcohol-Povidone (REFRESH OP) Apply 1 drop to eye 4 (four) times daily as needed (dry eyes).     [provider]  REPATHA SURECLICK 335 MG/ML SOAJ INJECT 1 PEN SUBCUTANEOUSLY INTO THE SKIN EVERY 14 DAYS 03/01/21   End, Harrell Gave, MD  telmisartan (MICARDIS) 80 MG tablet Take 80 mg by mouth daily.    [provider]  Tetrahydrozoline HCl (EYE DROPS OP) Place 1 drop into both eyes at bedtime as needed. Refresh eye gel    [provider]    Allergies    Citric acid, Codeine, Penicillins, Statins, Tricor [fenofibrate], Verapamil, and Sulfa antibiotics  Review of Systems   Review of Systems  All other systems reviewed and are negative.   Physical Exam Updated Vital Signs BP (!) 162/75   Pulse (!) 52   Temp 98 F (36.7 C) (Oral)   Resp 17   Ht 1.626 m (5\' 4" )   Wt 59 kg   LMP  (LMP Unknown)   SpO2 100%   BMI 22.31 kg/m   Physical Exam Vitals and nursing note reviewed.  Constitutional:      General: She is not in acute distress.    Appearance: Normal appearance. She is well-developed. She is not toxic-appearing.  HENT:     Head: Normocephalic and atraumatic.  Eyes:     General: Lids are normal.     Conjunctiva/sclera: Conjunctivae normal.     Pupils: Pupils are equal, round, and reactive to light.  Neck:     Thyroid: No thyroid mass.     Trachea: No tracheal deviation.   Cardiovascular:     Rate and Rhythm: Normal rate and regular rhythm.     Heart sounds: Normal heart sounds. No murmur heard. No gallop.   Pulmonary:     Effort: Pulmonary effort is normal. No respiratory distress.     Breath sounds: Normal breath sounds. No stridor. No decreased breath sounds, wheezing, rhonchi or rales.  Chest:    Abdominal:     General: Bowel sounds are normal. There is no distension.     Palpations: Abdomen is soft.     Tenderness: There is no abdominal tenderness. There is no rebound.  Musculoskeletal:        General: Normal range of motion.     Left shoulder: Tenderness and bony tenderness present. Normal range of motion.     Cervical back: Normal range  of motion and neck supple.  Skin:    General: Skin is warm and dry.     Findings: No abrasion or rash.  Neurological:     General: No focal deficit present.     Mental Status: She is alert and oriented to person, place, and time.     GCS: GCS eye subscore is 4. GCS  verbal subscore is 5. GCS motor subscore is 6.     Cranial Nerves: No cranial nerve deficit.     Sensory: No sensory deficit.  Psychiatric:        Speech: Speech normal.        Behavior: Behavior normal.     ED Results / Procedures / Treatments   Labs (all labs ordered are listed, but only abnormal results are displayed) Labs Reviewed - No data to display  EKG None  Radiology No results found.  Procedures Procedures   Medications Ordered in ED Medications - No data to display  ED Course  I have reviewed the triage vital signs and the nursing notes.  Pertinent labs & imaging results that were available during my care of the patient were reviewed by me and considered in my medical decision making (see chart for details).    MDM Rules/Calculators/A&P                          Patient's imaging here is negative.  Stable for discharge Final Clinical Impression(s) / ED Diagnoses Final diagnoses:  None    Rx / DC Orders ED Discharge Orders    None       Lacretia Leigh, MD 03/06/21 2057

## 2021-03-29 ENCOUNTER — Ambulatory Visit (INDEPENDENT_AMBULATORY_CARE_PROVIDER_SITE_OTHER): Payer: Medicare HMO | Admitting: Internal Medicine

## 2021-03-29 ENCOUNTER — Other Ambulatory Visit: Payer: Self-pay

## 2021-03-29 ENCOUNTER — Encounter: Payer: Self-pay | Admitting: Internal Medicine

## 2021-03-29 VITALS — BP 142/70 | HR 49 | Ht 64.0 in | Wt 134.0 lb

## 2021-03-29 DIAGNOSIS — R0989 Other specified symptoms and signs involving the circulatory and respiratory systems: Secondary | ICD-10-CM

## 2021-03-29 DIAGNOSIS — I25118 Atherosclerotic heart disease of native coronary artery with other forms of angina pectoris: Secondary | ICD-10-CM

## 2021-03-29 DIAGNOSIS — I351 Nonrheumatic aortic (valve) insufficiency: Secondary | ICD-10-CM | POA: Insufficient documentation

## 2021-03-29 DIAGNOSIS — E782 Mixed hyperlipidemia: Secondary | ICD-10-CM

## 2021-03-29 DIAGNOSIS — I340 Nonrheumatic mitral (valve) insufficiency: Secondary | ICD-10-CM | POA: Diagnosis not present

## 2021-03-29 NOTE — Progress Notes (Signed)
Follow-up Outpatient Visit Date: 03/29/2021  Primary Care Provider: Practice, [place] [phone]  Chief Complaint: Follow-up elevated blood pressure and valvular heart disease  HPI:  Debra Barry is a 70 y.o. female with history of coronary artery disease (medically managed), aortic and mitral regurgitation, paroxysmal atrial tachycardia, hypertension, hyperlipidemia, impaired glucose tolerance, and hypothyroidism, who presents for follow-up of coronary artery disease, valvular heart disease, and hypertension.  I last saw her in 09/2020, at which time she was feeling relatively well though she continued to have quite a bit of stress at home.  Today, Ms. Kipp reports she is doing fairly well.  She is recovering from a fall last month.  She was moving things out of her sister's home when she lost her balance and fell down brick stairs.  She had multiple abrasions and bruises but no fractures or other major injuries.  Ms. Pardo notes that her blood pressure has been labile, ranging from 120 to 595 mmHg systolic.  When her blood pressure is in the 120s, she feels very tired and dizzy, having to lie in bed.  She reports being compliant with her medications.  She has not had any chest pain, shortness of breath, orthopnea or edema.  She notes palpitations only when she lies on her left side.  --------------------------------------------------------------------------------------------------  Past Medical History:  Diagnosis Date  . Arthritis   . Complication of anesthesia   . Heart murmur   . Hx of adenomatous polyp of colon 08/11/2015  . Hyperlipidemia   . Hypertension   . Hypothyroidism   . Impaired glucose tolerance   . Patella fracture right    2014  . Plantar fascial fibromatosis    left foot  . PONV (postoperative nausea and vomiting)    Past Surgical History:  Procedure Laterality Date  . APPENDECTOMY  1981  . COLON RESECTION  1985    benign tumor  . FOOT SURGERY Left 2003  . patella fracture repair Left 2007  . PLANTAR FASCIA RELEASE Left 02/09/2016   Procedure: LEFT FOOT ENDOSCOPIC PLANTAR FASCIOTOMY;  Surgeon: [name], MD;  Location: [facility];  Service: Orthopedics;  Laterality: Left;  . THYROIDECTOMY  1995  . VAGINAL HYSTERECTOMY  1983    Current Meds  Medication Sig  . amLODipine (NORVASC) 10 MG tablet Take 10 mg by mouth daily.  Marland Kitchen aspirin EC 81 MG tablet Take 81 mg by mouth daily.  . cyclobenzaprine (FLEXERIL) 10 MG tablet Take 10 mg by mouth daily as needed.  . isosorbide mononitrate (IMDUR) 30 MG 24 hr tablet Take 1 tablet (30 mg total) by mouth daily.  Marland Kitchen levothyroxine (SYNTHROID, LEVOTHROID) 75 MCG tablet Take 75 mcg by mouth daily before breakfast.   . multivitamin-lutein (OCUVITE-LUTEIN) CAPS capsule Take 1 capsule by mouth daily.  . Omega-3 Fatty Acids (FISH OIL) 1000 MG CPDR Take 2 capsules by mouth 2 (two) times daily.  . Polyvinyl Alcohol-Povidone (REFRESH OP) Apply 1 drop to eye 4 (four) times daily as needed (dry eyes).   Marland Kitchen REPATHA SURECLICK 638 MG/ML SOAJ INJECT 1 PEN SUBCUTANEOUSLY INTO THE SKIN EVERY 14 DAYS  . telmisartan (MICARDIS) 80 MG tablet Take 80 mg by mouth daily.  . Tetrahydrozoline HCl (EYE DROPS OP) Place 1 drop into both eyes at bedtime as needed. Refresh eye gel    Allergies: Citric acid, Codeine, Penicillins, Statins, Tricor [fenofibrate], Verapamil, and Sulfa antibiotics  Social History   Tobacco Use  . Smoking  status: Former Smoker    Packs/day: 0.25    Types: Cigarettes    Quit date: 10/22/1974    Years since quitting: 46.4  . Smokeless tobacco: Never Used  Vaping Use  . Vaping Use: Never used  Substance Use Topics  . Alcohol use: No    Alcohol/week: 0.0 standard drinks  . Drug use: No    Family History  Problem Relation Age of Onset  . Rectal cancer Father   . Hepatitis Father   . Hypertension Mother   . Hyperlipidemia Mother   . Glaucoma  Mother   . Stroke Mother        TIA  . Heart disease Maternal Grandfather   . Colon cancer Neg Hx     Review of Systems: A 12-system review of systems was performed and was negative except as noted in the HPI.  --------------------------------------------------------------------------------------------------  Physical Exam: BP (!) 142/70 (BP Location: Left Arm, Patient Position: Sitting, Cuff Size: Normal)   Pulse (!) 49   Ht 5\' 4"  (1.626 m)   Wt 134 lb (60.8 kg)   LMP  (LMP Unknown)   SpO2 98%   BMI 23.00 kg/m   General:  NAD. Neck: No JVD or HJR. Lungs: Clear to auscultation bilaterally without wheezes or crackles. Heart: Bradycardic but regular with 3/6 systolic murmur. Abdomen: Soft, nontender, nondistended. Extremities: No lower extremity edema.  EKG: Sinus bradycardia (heart rate 49 bpm) with left axis deviation.  PACs are no longer present.  Otherwise, no significant change since 09/28/2020.  Lab Results  Component Value Date   WBC 3.2 (L) 09/27/2016   HGB 10.2 (L) 09/27/2016   HCT 31.6 (L) 09/27/2016   MCV 94.0 09/27/2016   PLT 183 09/27/2016    Lab Results  Component Value Date   NA 138 09/27/2016   K 3.8 09/27/2016   CL 105 09/27/2016   CO2 24 09/27/2016   BUN 43 (H) 09/27/2016   CREATININE 1.91 (H) 09/27/2016   GLUCOSE 89 09/27/2016   ALT 17 07/21/2018    Lab Results  Component Value Date   CHOL 143 07/21/2018   HDL 38 (L) 07/21/2018   LDLCALC 63 07/21/2018   TRIG 209 (H) 07/21/2018   CHOLHDL 3.8 07/21/2018   Outside labs (12/09/2020): Total cholesterol 116, triglycerides 237, HDL 32, LDL 46.  CMP: Sodium 142, potassium 4.4, chloride 105, CO2 22, BUN 27, creatinine 1.45, glucose 99, calcium 9.4, AST 22, ALT 8, alkaline phosphatase 54, total bilirubin 0.3, total protein 6.4, albumin 4.0  CBC: WBC 4.7, Hgb 12.4, HCT 36.2, PLT 285  --------------------------------------------------------------------------------------------------  ASSESSMENT  AND PLAN: Labile hypertension: Blood pressure borderline elevated today but still somewhat labile at home.  Ms. Bocek does not tolerate normal blood pressures very well due to the fatigue and lightheadedness.  We will defer medication changes today and have her continue to follow her blood pressure closely at home.  Continued sodium restriction was encouraged.  Coronary artery disease with stable angina: No angina reported.  We will continue current antianginal therapy consisting of amlodipine and isosorbide mononitrate as well as PCSK9 inhibitor therapy for lipid reduction.  Valvular heart disease (mitral and aortic regurgitation): Prominent systolic murmur again noted on exam today with most recent echo in 12/2019 showing mild mitral and aortic regurgitation.  We will continue Ms. Piggott's current medications and plan to repeat an echocardiogram for follow-up of her valvular disease when she returns to see me in 9 months.  Hyperlipidemia: Most recent lipid panel through PCP in  February showed excellent control of LDL.  Triglycerides were mildly elevated.  We will plan to continue with Repatha.  Follow-up: Return to clinic in 9 months with echocardiogram to be performed at that time  Nelva Bush, MD 03/29/2021 11:38 AM

## 2021-03-29 NOTE — Patient Instructions (Addendum)
Medication Instructions:  Your physician recommends that you continue on your current medications as directed. Please refer to the Current Medication list given to you today.  *If you need a refill on your cardiac medications before your next appointment, please call your pharmacy*   Lab Work: None ordered If you have labs (blood work) drawn today and your tests are completely normal, you will receive your results only by: Marland Kitchen MyChart Message (if you have MyChart) OR . A paper copy in the mail If you have any lab test that is abnormal or we need to change your treatment, we will call you to review the results.   Testing/Procedures: Your physician has requested that you have an echocardiogram. Echocardiography is a painless test that uses sound waves to create images of your heart. It provides your doctor with information about the size and shape of your heart and how well your heart's chambers and valves are working. This procedure takes approximately one hour. There are no restrictions for this procedure. (To be scheduled in 9 months)   Follow-Up: At [facility], you and your health needs are our priority.  As part of our continuing mission to provide you with exceptional heart care, we have created designated Provider Care Teams.  These Care Teams include your primary Cardiologist (physician) and Advanced Practice Providers (APPs -  Physician Assistants and Nurse Practitioners) who all work together to provide you with the care you need, when you need it.  We recommend signing up for the patient portal called "MyChart".  Sign up information is provided on this After Visit Summary.  MyChart is used to connect with patients for Virtual Visits (Telemedicine).  Patients are able to view lab/test results, encounter notes, upcoming appointments, etc.  Non-urgent messages can be sent to your provider as well.   To learn more about what you can do with MyChart, go to [email or web address].     Your next appointment:   Your physician wants you to follow-up in: 9 months You will receive a reminder letter in the mail two months in advance. If you don't receive a letter, please call our office to schedule the follow-up appointment. (To be scheduled the same day as the echo)  The format for your next appointment:   In Person  Provider:   You may see Nelva Bush, MD or one of the following Advanced Practice Providers on your designated Care Team:    Murray Hodgkins, NP  Christell Faith, PA-C  Marrianne Mood, PA-C  Cadence Cowlic, [place]  Laurann Montana, NP    Other Instructions N/A

## 2021-04-01 ENCOUNTER — Other Ambulatory Visit: Payer: Self-pay | Admitting: Internal Medicine

## 2021-06-02 ENCOUNTER — Other Ambulatory Visit: Payer: Self-pay | Admitting: Internal Medicine

## 2021-06-09 DIAGNOSIS — N183 Chronic kidney disease, stage 3 unspecified: Secondary | ICD-10-CM | POA: Diagnosis not present

## 2021-06-09 DIAGNOSIS — Z6822 Body mass index (BMI) 22.0-22.9, adult: Secondary | ICD-10-CM | POA: Diagnosis not present

## 2021-06-09 DIAGNOSIS — E890 Postprocedural hypothyroidism: Secondary | ICD-10-CM | POA: Diagnosis not present

## 2021-06-09 DIAGNOSIS — I10 Essential (primary) hypertension: Secondary | ICD-10-CM | POA: Diagnosis not present

## 2021-06-09 DIAGNOSIS — I2510 Atherosclerotic heart disease of native coronary artery without angina pectoris: Secondary | ICD-10-CM | POA: Diagnosis not present

## 2021-06-09 DIAGNOSIS — E782 Mixed hyperlipidemia: Secondary | ICD-10-CM | POA: Diagnosis not present

## 2021-06-09 DIAGNOSIS — R7303 Prediabetes: Secondary | ICD-10-CM | POA: Diagnosis not present

## 2021-06-09 DIAGNOSIS — M810 Age-related osteoporosis without current pathological fracture: Secondary | ICD-10-CM | POA: Diagnosis not present

## 2021-06-12 ENCOUNTER — Encounter: Payer: Self-pay | Admitting: Internal Medicine

## 2021-06-22 DIAGNOSIS — Z0000 Encounter for general adult medical examination without abnormal findings: Secondary | ICD-10-CM | POA: Diagnosis not present

## 2021-06-22 DIAGNOSIS — Z1331 Encounter for screening for depression: Secondary | ICD-10-CM | POA: Diagnosis not present

## 2021-06-22 DIAGNOSIS — Z9181 History of falling: Secondary | ICD-10-CM | POA: Diagnosis not present

## 2021-06-22 DIAGNOSIS — E785 Hyperlipidemia, unspecified: Secondary | ICD-10-CM | POA: Diagnosis not present

## 2021-07-06 DIAGNOSIS — I10 Essential (primary) hypertension: Secondary | ICD-10-CM | POA: Diagnosis not present

## 2021-07-06 DIAGNOSIS — Z6822 Body mass index (BMI) 22.0-22.9, adult: Secondary | ICD-10-CM | POA: Diagnosis not present

## 2021-07-06 DIAGNOSIS — R001 Bradycardia, unspecified: Secondary | ICD-10-CM | POA: Diagnosis not present

## 2021-07-06 DIAGNOSIS — Z20822 Contact with and (suspected) exposure to covid-19: Secondary | ICD-10-CM | POA: Diagnosis not present

## 2021-07-06 DIAGNOSIS — R5383 Other fatigue: Secondary | ICD-10-CM | POA: Diagnosis not present

## 2021-07-13 DIAGNOSIS — I2510 Atherosclerotic heart disease of native coronary artery without angina pectoris: Secondary | ICD-10-CM | POA: Diagnosis not present

## 2021-07-13 DIAGNOSIS — Z6822 Body mass index (BMI) 22.0-22.9, adult: Secondary | ICD-10-CM | POA: Diagnosis not present

## 2021-07-13 DIAGNOSIS — Z23 Encounter for immunization: Secondary | ICD-10-CM | POA: Diagnosis not present

## 2021-07-13 DIAGNOSIS — I10 Essential (primary) hypertension: Secondary | ICD-10-CM | POA: Diagnosis not present

## 2021-08-07 DIAGNOSIS — H43813 Vitreous degeneration, bilateral: Secondary | ICD-10-CM | POA: Diagnosis not present

## 2021-08-07 DIAGNOSIS — H2513 Age-related nuclear cataract, bilateral: Secondary | ICD-10-CM | POA: Diagnosis not present

## 2021-08-07 DIAGNOSIS — H10413 Chronic giant papillary conjunctivitis, bilateral: Secondary | ICD-10-CM | POA: Diagnosis not present

## 2021-09-11 DIAGNOSIS — Z6823 Body mass index (BMI) 23.0-23.9, adult: Secondary | ICD-10-CM | POA: Diagnosis not present

## 2021-09-11 DIAGNOSIS — K1379 Other lesions of oral mucosa: Secondary | ICD-10-CM | POA: Diagnosis not present

## 2021-09-11 DIAGNOSIS — J029 Acute pharyngitis, unspecified: Secondary | ICD-10-CM | POA: Diagnosis not present

## 2021-09-20 DIAGNOSIS — Z87891 Personal history of nicotine dependence: Secondary | ICD-10-CM | POA: Diagnosis not present

## 2021-09-20 DIAGNOSIS — H903 Sensorineural hearing loss, bilateral: Secondary | ICD-10-CM | POA: Diagnosis not present

## 2021-09-22 DIAGNOSIS — C44311 Basal cell carcinoma of skin of nose: Secondary | ICD-10-CM | POA: Diagnosis not present

## 2021-09-28 ENCOUNTER — Telehealth: Payer: Self-pay | Admitting: *Deleted

## 2021-09-28 NOTE — Telephone Encounter (Signed)
Pt requiring PA for Repatha 140 mg/ml inj. PA has been submitted via covermymeds. Awaiting approval.

## 2021-09-29 NOTE — Telephone Encounter (Signed)
Pt has been approved for Repatha 140 mg/ml. 09/28/2021-10/21/2022.

## 2021-10-24 DIAGNOSIS — Z08 Encounter for follow-up examination after completed treatment for malignant neoplasm: Secondary | ICD-10-CM | POA: Diagnosis not present

## 2021-10-24 DIAGNOSIS — Z85828 Personal history of other malignant neoplasm of skin: Secondary | ICD-10-CM | POA: Diagnosis not present

## 2021-10-25 DIAGNOSIS — E890 Postprocedural hypothyroidism: Secondary | ICD-10-CM | POA: Diagnosis not present

## 2021-10-25 DIAGNOSIS — E782 Mixed hyperlipidemia: Secondary | ICD-10-CM | POA: Diagnosis not present

## 2021-10-25 DIAGNOSIS — Z6823 Body mass index (BMI) 23.0-23.9, adult: Secondary | ICD-10-CM | POA: Diagnosis not present

## 2021-10-25 DIAGNOSIS — I10 Essential (primary) hypertension: Secondary | ICD-10-CM | POA: Diagnosis not present

## 2021-10-25 DIAGNOSIS — I2510 Atherosclerotic heart disease of native coronary artery without angina pectoris: Secondary | ICD-10-CM | POA: Diagnosis not present

## 2021-10-25 DIAGNOSIS — N183 Chronic kidney disease, stage 3 unspecified: Secondary | ICD-10-CM | POA: Diagnosis not present

## 2021-10-25 DIAGNOSIS — R7303 Prediabetes: Secondary | ICD-10-CM | POA: Diagnosis not present

## 2021-10-25 DIAGNOSIS — M810 Age-related osteoporosis without current pathological fracture: Secondary | ICD-10-CM | POA: Diagnosis not present

## 2021-11-04 ENCOUNTER — Other Ambulatory Visit: Payer: Self-pay | Admitting: Internal Medicine

## 2021-11-19 ENCOUNTER — Other Ambulatory Visit: Payer: Self-pay | Admitting: Internal Medicine

## 2021-12-20 DIAGNOSIS — H02051 Trichiasis without entropian right upper eyelid: Secondary | ICD-10-CM | POA: Diagnosis not present

## 2021-12-20 DIAGNOSIS — H10413 Chronic giant papillary conjunctivitis, bilateral: Secondary | ICD-10-CM | POA: Diagnosis not present

## 2021-12-20 DIAGNOSIS — H43813 Vitreous degeneration, bilateral: Secondary | ICD-10-CM | POA: Diagnosis not present

## 2021-12-20 DIAGNOSIS — H2513 Age-related nuclear cataract, bilateral: Secondary | ICD-10-CM | POA: Diagnosis not present

## 2022-01-02 ENCOUNTER — Ambulatory Visit (INDEPENDENT_AMBULATORY_CARE_PROVIDER_SITE_OTHER): Payer: Medicare HMO

## 2022-01-02 ENCOUNTER — Other Ambulatory Visit: Payer: Self-pay

## 2022-01-02 DIAGNOSIS — I340 Nonrheumatic mitral (valve) insufficiency: Secondary | ICD-10-CM | POA: Diagnosis not present

## 2022-01-02 DIAGNOSIS — I351 Nonrheumatic aortic (valve) insufficiency: Secondary | ICD-10-CM | POA: Diagnosis not present

## 2022-01-03 LAB — ECHOCARDIOGRAM COMPLETE
AR max vel: 2.03 cm2
AV Area VTI: 2.23 cm2
AV Area mean vel: 2.23 cm2
AV Mean grad: 4.5 mmHg
AV Peak grad: 9.9 mmHg
AV Vena cont: 0.3 cm
Ao pk vel: 1.57 m/s
Area-P 1/2: 3.89 cm2
Calc EF: 57.9 %
P 1/2 time: 674 msec
S' Lateral: 3 cm
Single Plane A2C EF: 60.3 %
Single Plane A4C EF: 51.3 %

## 2022-01-08 NOTE — Progress Notes (Signed)
? ?Follow-up Outpatient Visit ?Date: 01/10/2022 ? ?Primary Care Provider: ?[facility] ?[place] [address] ? ?Chief Complaint: Follow-up valvular heart disease and hypertension ? ?HPI:  Debra Barry is a 71 y.o. female with history of coronary artery disease (medically managed), aortic and mitral regurgitation, paroxysmal atrial tachycardia, hypertension, hyperlipidemia, impaired glucose tolerance, and hypothyroidism, who presents for follow-up of valvular heart disease and hypertension.  I last saw her in 03/2021, at which time she was feeling well though she continued to note labile blood pressures.  We did not make any medication changes.  Follow-up echocardiogram last week showed relatively stable mitral and aortic valve regurgitation with preserved LVEF. ? ?Today, Debra Barry reports that she has been feeling fairly well.  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  Debra Barry notes that her blood pressure remains somewhat labile, exacerbated by stress.  Systolic readings are typically in the 140s and 150s; if it drops into the 130s or lower, she does not feel well with fatigue and nausea.  She reports an episode in February where she had significant emesis, which resolved spontaneously after a day.  She denies chest pain, palpitations, lightheadedness, and edema.  She is not exercising regularly due to having to care for her sister. ? ?-------------------------------------------------------------------------------------------------- ? ?Past Medical History:  ?Diagnosis Date  ? Arthritis   ? Complication of anesthesia   ? Heart murmur   ? Hx of adenomatous polyp of colon 08/11/2015  ? Hyperlipidemia   ? Hypertension   ? Hypothyroidism   ? Impaired glucose tolerance   ? Patella fracture right   ? 2014  ? Plantar fascial fibromatosis   ? left foot  ? PONV (postoperative nausea and vomiting)   ? ?Past Surgical  History:  ?Procedure Laterality Date  ? APPENDECTOMY  1981  ? COLON RESECTION  1985  ? benign tumor  ? FOOT SURGERY Left 2003  ? patella fracture repair Left 2007  ? PLANTAR FASCIA RELEASE Left 02/09/2016  ? Procedure: LEFT FOOT ENDOSCOPIC PLANTAR FASCIOTOMY;  Surgeon: [name], MD;  Location: [facility];  Service: Orthopedics;  Laterality: Left;  ? THYROIDECTOMY  1995  ? VAGINAL HYSTERECTOMY  1983  ? ? ?Current Meds  ?Medication Sig  ? amLODipine (NORVASC) 10 MG tablet Take 10 mg by mouth daily.  ? aspirin EC 81 MG tablet Take 81 mg by mouth daily.  ? cyclobenzaprine (FLEXERIL) 10 MG tablet Take 10 mg by mouth daily as needed.  ? isosorbide mononitrate (IMDUR) 30 MG 24 hr tablet Take 1 tablet by mouth once daily  ? levothyroxine (SYNTHROID, LEVOTHROID) 75 MCG tablet Take 75 mcg by mouth daily before breakfast.   ? metoprolol succinate (TOPROL-XL) 25 MG 24 hr tablet Take 1 tablet by mouth daily.  ? multivitamin-lutein (OCUVITE-LUTEIN) CAPS capsule Take 1 capsule by mouth daily.  ? Omega-3 Fatty Acids (FISH OIL) 1000 MG CPDR Take 2 capsules by mouth 2 (two) times daily.  ? Polyvinyl Alcohol-Povidone (REFRESH OP) Apply 1 drop to eye 4 (four) times daily as needed (dry eyes).   ? REPATHA SURECLICK 315 MG/ML SOAJ INJECT  1 PEN SUBCUTANEOUSLY EVERY 14 DAYS  ? telmisartan (MICARDIS) 80 MG tablet Take 80 mg by mouth daily.  ? Tetrahydrozoline HCl (EYE DROPS OP) Place 1 drop into both eyes at bedtime as needed. Refresh eye gel  ? ? ?Allergies: Citric acid, Codeine, Penicillins, Statins,  Tricor [fenofibrate], Verapamil, and Sulfa antibiotics ? ?Social History  ? ?Tobacco Use  ? Smoking status: Former  ?  Packs/day: 0.25  ?  Types: Cigarettes  ?  Quit date: 10/22/1974  ?  Years since quitting: 66.2  ? Smokeless tobacco: Never  ?Vaping Use  ? Vaping Use: Never used  ?Substance Use Topics  ? Alcohol use: No  ?  Alcohol/week: 0.0 standard drinks  ? Drug use: No  ? ? ?Family History  ?Problem Relation Age of  Onset  ? Hypertension Mother   ? Hyperlipidemia Mother   ? Glaucoma Mother   ? Stroke Mother   ?     TIA  ? Rectal cancer Father   ? Hepatitis Father   ? Heart disease Maternal Grandfather   ? Colon cancer Neg Hx   ? ? ?Review of Systems: ?A 12-system review of systems was performed and was negative except as noted in the HPI. ? ?-------------------------------------------------------------------------------------------------- ? ?Physical Exam: ?BP 134/60 (BP Location: Left Arm, Patient Position: Sitting, Cuff Size: Normal)   Pulse (!) 50   Ht 5' 4"  (1.626 m)   Wt 145 lb (65.8 kg)   LMP  (LMP Unknown)   SpO2 99%   BMI 24.89 kg/m?  ? ?General:  NAD. ?Neck: No JVD or HJR. ?Lungs: Clear to auscultation bilaterally without wheezes or crackles. ?Heart: Bradycardic but regular with 2/6 systolic murmur.  No rubs or gallops. ?Abdomen: Soft, nontender, nondistended. ?Extremities: No lower extremity edema. ? ?EKG: Sinus bradycardia with left axis deviation, borderline LVH, and nonspecific ST changes.  No significant change from prior tracing on 03/29/2021. ? ?Lab Results  ?Component Value Date  ? WBC 3.2 (L) 09/27/2016  ? HGB 10.2 (L) 09/27/2016  ? HCT 31.6 (L) 09/27/2016  ? MCV 94.0 09/27/2016  ? PLT 183 09/27/2016  ? ? ?Lab Results  ?Component Value Date  ? NA 138 09/27/2016  ? K 3.8 09/27/2016  ? CL 105 09/27/2016  ? CO2 24 09/27/2016  ? BUN 43 (H) 09/27/2016  ? CREATININE 1.91 (H) 09/27/2016  ? GLUCOSE 89 09/27/2016  ? ALT 17 07/21/2018  ? ? ?Lab Results  ?Component Value Date  ? CHOL 143 07/21/2018  ? HDL 38 (L) 07/21/2018  ? Hana 63 07/21/2018  ? TRIG 209 (H) 07/21/2018  ? CHOLHDL 3.8 07/21/2018  ? ? ?-------------------------------------------------------------------------------------------------- ? ?ASSESSMENT AND PLAN: ?Labile hypertension: ?Blood pressure today is upper normal, better than most home readings.  It should be noted that there was some discrepancy between our blood pressure cuff and the patient's  cuff in the office today, with her cuff measuring approximately 20 mmHg higher for the systolic reading.  Given that she does not feel well with more aggressive blood pressure control, we will defer medication changes today.  I have encouraged her to continue monitoring her blood pressure and to limit her sodium intake. ? ?Coronary artery disease with stable angina: ?No angina reported continue current regimen of amlodipine, metoprolol, and isosorbide mononitrate for antianginal therapy.  Aspirin and Repatha also to be continued. ? ?Valvular heart disease: ?No signs or symptoms of heart failure noted today.  Echocardiogram last week showed preserved LVEF with mild-moderate mitral and aortic regurgitation.  Continue current medications and clinical follow-up. ? ?Paroxysmal atrial tachycardia: ?Minimal self-limited palpitations reported.  Continue low-dose metoprolol.  Resting bradycardia precludes further dose-escalation at this time. ? ?Hyperlipidemia: ?Most recent lipid panel in 05/2021 through her PCP showed LDL of 102, up from prior assay.  We will plan  to continue with Repatha pending repeat labs next month with her PCP.  Lifestyle modifications encouraged. ? ?Chronic kidney disease stage 3b: ?Creatinine noted to be 1.5 (eGFR 39) on last check in 05/2021 through PCP.  Continue current dose of telmisartan with ongoing blood pressure control as outlined above.  Nephrotoxic agents, particularly NSAIDs, should be avoided. ? ?Follow-up: Return to clinic in 1 year. ? ?Nelva Bush, MD ?01/10/2022 ?9:17 AM ? ?

## 2022-01-09 DIAGNOSIS — Z1231 Encounter for screening mammogram for malignant neoplasm of breast: Secondary | ICD-10-CM | POA: Diagnosis not present

## 2022-01-09 DIAGNOSIS — M85851 Other specified disorders of bone density and structure, right thigh: Secondary | ICD-10-CM | POA: Diagnosis not present

## 2022-01-09 DIAGNOSIS — M810 Age-related osteoporosis without current pathological fracture: Secondary | ICD-10-CM | POA: Diagnosis not present

## 2022-01-10 ENCOUNTER — Ambulatory Visit (INDEPENDENT_AMBULATORY_CARE_PROVIDER_SITE_OTHER): Payer: Medicare HMO | Admitting: Internal Medicine

## 2022-01-10 ENCOUNTER — Other Ambulatory Visit: Payer: Self-pay

## 2022-01-10 ENCOUNTER — Encounter: Payer: Self-pay | Admitting: Internal Medicine

## 2022-01-10 VITALS — BP 134/60 | HR 50 | Ht 64.0 in | Wt 145.0 lb

## 2022-01-10 DIAGNOSIS — I38 Endocarditis, valve unspecified: Secondary | ICD-10-CM | POA: Diagnosis not present

## 2022-01-10 DIAGNOSIS — R0989 Other specified symptoms and signs involving the circulatory and respiratory systems: Secondary | ICD-10-CM

## 2022-01-10 DIAGNOSIS — N1832 Chronic kidney disease, stage 3b: Secondary | ICD-10-CM

## 2022-01-10 DIAGNOSIS — I471 Supraventricular tachycardia: Secondary | ICD-10-CM

## 2022-01-10 DIAGNOSIS — I25118 Atherosclerotic heart disease of native coronary artery with other forms of angina pectoris: Secondary | ICD-10-CM | POA: Diagnosis not present

## 2022-01-10 DIAGNOSIS — E782 Mixed hyperlipidemia: Secondary | ICD-10-CM | POA: Diagnosis not present

## 2022-01-10 NOTE — Patient Instructions (Signed)
Medication Instructions:  ? ?Your physician recommends that you continue on your current medications as directed. Please refer to the Current Medication list given to you today. ? ?*If you need a refill on your cardiac medications before your next appointment, please call your pharmacy* ? ? ?Lab Work: ? ?None ordered ? ?Testing/Procedures: ? ?None ordered ? ? ?Follow-Up: ?At [facility], you and your health needs are our priority.  As part of our continuing mission to provide you with exceptional heart care, we have created designated Provider Care Teams.  These Care Teams include your primary Cardiologist (physician) and Advanced Practice Providers (APPs -  Physician Assistants and Nurse Practitioners) who all work together to provide you with the care you need, when you need it. ? ?We recommend signing up for the patient portal called "MyChart".  Sign up information is provided on this After Visit Summary.  MyChart is used to connect with patients for Virtual Visits (Telemedicine).  Patients are able to view lab/test results, encounter notes, upcoming appointments, etc.  Non-urgent messages can be sent to your provider as well.   ?To learn more about what you can do with MyChart, go to [email or web address].   ? ?Your next appointment:   ?1 year(s) ? ?The format for your next appointment:   ?In Person ? ?Provider:   ?You may see Nelva Bush, MD or one of the following Advanced Practice Providers on your designated Care Team:   ?Murray Hodgkins, NP ?Christell Faith, PA-C ?Cadence Kathlen Mody, PA-C ?

## 2022-01-23 DIAGNOSIS — L578 Other skin changes due to chronic exposure to nonionizing radiation: Secondary | ICD-10-CM | POA: Diagnosis not present

## 2022-01-23 DIAGNOSIS — Z08 Encounter for follow-up examination after completed treatment for malignant neoplasm: Secondary | ICD-10-CM | POA: Diagnosis not present

## 2022-01-23 DIAGNOSIS — Z85828 Personal history of other malignant neoplasm of skin: Secondary | ICD-10-CM | POA: Diagnosis not present

## 2022-01-24 DIAGNOSIS — Z139 Encounter for screening, unspecified: Secondary | ICD-10-CM | POA: Diagnosis not present

## 2022-01-24 DIAGNOSIS — E782 Mixed hyperlipidemia: Secondary | ICD-10-CM | POA: Diagnosis not present

## 2022-01-24 DIAGNOSIS — N183 Chronic kidney disease, stage 3 unspecified: Secondary | ICD-10-CM | POA: Diagnosis not present

## 2022-01-24 DIAGNOSIS — Z6823 Body mass index (BMI) 23.0-23.9, adult: Secondary | ICD-10-CM | POA: Diagnosis not present

## 2022-01-24 DIAGNOSIS — I2510 Atherosclerotic heart disease of native coronary artery without angina pectoris: Secondary | ICD-10-CM | POA: Diagnosis not present

## 2022-01-24 DIAGNOSIS — I10 Essential (primary) hypertension: Secondary | ICD-10-CM | POA: Diagnosis not present

## 2022-01-24 DIAGNOSIS — E890 Postprocedural hypothyroidism: Secondary | ICD-10-CM | POA: Diagnosis not present

## 2022-01-24 DIAGNOSIS — R7303 Prediabetes: Secondary | ICD-10-CM | POA: Diagnosis not present

## 2022-01-24 DIAGNOSIS — M810 Age-related osteoporosis without current pathological fracture: Secondary | ICD-10-CM | POA: Diagnosis not present

## 2022-01-29 ENCOUNTER — Other Ambulatory Visit: Payer: Self-pay | Admitting: Internal Medicine

## 2022-02-14 ENCOUNTER — Other Ambulatory Visit: Payer: Self-pay | Admitting: Internal Medicine

## 2022-04-04 DIAGNOSIS — L658 Other specified nonscarring hair loss: Secondary | ICD-10-CM | POA: Diagnosis not present

## 2022-04-27 DIAGNOSIS — R7303 Prediabetes: Secondary | ICD-10-CM | POA: Diagnosis not present

## 2022-04-27 DIAGNOSIS — E782 Mixed hyperlipidemia: Secondary | ICD-10-CM | POA: Diagnosis not present

## 2022-04-27 DIAGNOSIS — I10 Essential (primary) hypertension: Secondary | ICD-10-CM | POA: Diagnosis not present

## 2022-04-27 DIAGNOSIS — R252 Cramp and spasm: Secondary | ICD-10-CM | POA: Diagnosis not present

## 2022-04-27 DIAGNOSIS — M810 Age-related osteoporosis without current pathological fracture: Secondary | ICD-10-CM | POA: Diagnosis not present

## 2022-04-27 DIAGNOSIS — I2510 Atherosclerotic heart disease of native coronary artery without angina pectoris: Secondary | ICD-10-CM | POA: Diagnosis not present

## 2022-04-27 DIAGNOSIS — N183 Chronic kidney disease, stage 3 unspecified: Secondary | ICD-10-CM | POA: Diagnosis not present

## 2022-04-27 DIAGNOSIS — E890 Postprocedural hypothyroidism: Secondary | ICD-10-CM | POA: Diagnosis not present

## 2022-05-20 ENCOUNTER — Other Ambulatory Visit: Payer: Self-pay | Admitting: Internal Medicine

## 2022-06-28 DIAGNOSIS — Z9181 History of falling: Secondary | ICD-10-CM | POA: Diagnosis not present

## 2022-06-28 DIAGNOSIS — E785 Hyperlipidemia, unspecified: Secondary | ICD-10-CM | POA: Diagnosis not present

## 2022-06-28 DIAGNOSIS — Z1331 Encounter for screening for depression: Secondary | ICD-10-CM | POA: Diagnosis not present

## 2022-06-28 DIAGNOSIS — Z0000 Encounter for general adult medical examination without abnormal findings: Secondary | ICD-10-CM | POA: Diagnosis not present

## 2022-08-03 DIAGNOSIS — I2510 Atherosclerotic heart disease of native coronary artery without angina pectoris: Secondary | ICD-10-CM | POA: Diagnosis not present

## 2022-08-03 DIAGNOSIS — R202 Paresthesia of skin: Secondary | ICD-10-CM | POA: Diagnosis not present

## 2022-08-03 DIAGNOSIS — M545 Low back pain, unspecified: Secondary | ICD-10-CM | POA: Diagnosis not present

## 2022-08-03 DIAGNOSIS — R7303 Prediabetes: Secondary | ICD-10-CM | POA: Diagnosis not present

## 2022-08-03 DIAGNOSIS — I10 Essential (primary) hypertension: Secondary | ICD-10-CM | POA: Diagnosis not present

## 2022-08-03 DIAGNOSIS — Z23 Encounter for immunization: Secondary | ICD-10-CM | POA: Diagnosis not present

## 2022-08-03 DIAGNOSIS — N183 Chronic kidney disease, stage 3 unspecified: Secondary | ICD-10-CM | POA: Diagnosis not present

## 2022-08-03 DIAGNOSIS — E782 Mixed hyperlipidemia: Secondary | ICD-10-CM | POA: Diagnosis not present

## 2022-08-03 DIAGNOSIS — E890 Postprocedural hypothyroidism: Secondary | ICD-10-CM | POA: Diagnosis not present

## 2022-08-08 DIAGNOSIS — H10413 Chronic giant papillary conjunctivitis, bilateral: Secondary | ICD-10-CM | POA: Diagnosis not present

## 2022-08-08 DIAGNOSIS — H2513 Age-related nuclear cataract, bilateral: Secondary | ICD-10-CM | POA: Diagnosis not present

## 2022-08-08 DIAGNOSIS — H02051 Trichiasis without entropian right upper eyelid: Secondary | ICD-10-CM | POA: Diagnosis not present

## 2022-08-08 DIAGNOSIS — H43813 Vitreous degeneration, bilateral: Secondary | ICD-10-CM | POA: Diagnosis not present

## 2022-09-04 DIAGNOSIS — H2511 Age-related nuclear cataract, right eye: Secondary | ICD-10-CM | POA: Diagnosis not present

## 2022-09-17 DIAGNOSIS — H2512 Age-related nuclear cataract, left eye: Secondary | ICD-10-CM | POA: Diagnosis not present

## 2022-09-20 DIAGNOSIS — H2512 Age-related nuclear cataract, left eye: Secondary | ICD-10-CM | POA: Diagnosis not present

## 2022-10-03 ENCOUNTER — Telehealth: Payer: Self-pay

## 2022-10-03 NOTE — Telephone Encounter (Signed)
Prior Authorization initiated by [email or web address]  KEY: [id number]  Response: This request has been approved.

## 2022-10-04 NOTE — Telephone Encounter (Signed)
Prior Authorization for Repatha is approved until 10/22/2023.  Patient is aware of the approval for Repatha.

## 2022-10-23 DIAGNOSIS — H524 Presbyopia: Secondary | ICD-10-CM | POA: Diagnosis not present

## 2022-10-23 DIAGNOSIS — Z0100 Encounter for examination of eyes and vision without abnormal findings: Secondary | ICD-10-CM | POA: Diagnosis not present

## 2022-10-26 ENCOUNTER — Other Ambulatory Visit: Payer: Self-pay | Admitting: Internal Medicine

## 2022-11-01 DIAGNOSIS — R7303 Prediabetes: Secondary | ICD-10-CM | POA: Diagnosis not present

## 2022-11-01 DIAGNOSIS — N183 Chronic kidney disease, stage 3 unspecified: Secondary | ICD-10-CM | POA: Diagnosis not present

## 2022-11-01 DIAGNOSIS — E782 Mixed hyperlipidemia: Secondary | ICD-10-CM | POA: Diagnosis not present

## 2022-11-01 DIAGNOSIS — I10 Essential (primary) hypertension: Secondary | ICD-10-CM | POA: Diagnosis not present

## 2022-11-01 DIAGNOSIS — E890 Postprocedural hypothyroidism: Secondary | ICD-10-CM | POA: Diagnosis not present

## 2022-11-01 DIAGNOSIS — I2510 Atherosclerotic heart disease of native coronary artery without angina pectoris: Secondary | ICD-10-CM | POA: Diagnosis not present

## 2022-11-25 ENCOUNTER — Other Ambulatory Visit: Payer: Self-pay | Admitting: Internal Medicine

## 2022-11-26 ENCOUNTER — Other Ambulatory Visit: Payer: Self-pay

## 2022-11-26 MED ORDER — ISOSORBIDE MONONITRATE ER 30 MG PO TB24: 30.0000 mg | ORAL_TABLET | Freq: Every day | ORAL | 3 refills | Status: DC

## 2022-11-26 NOTE — Telephone Encounter (Signed)
Requested Prescriptions   Signed Prescriptions Disp Refills   isosorbide mononitrate (IMDUR) 30 MG 24 hr tablet 90 tablet 3    Sig: Take 1 tablet (30 mg total) by mouth daily.    Authorizing Provider: END, CHRISTOPHER    Ordering User: Janan Ridge

## 2022-12-31 ENCOUNTER — Telehealth: Payer: Self-pay | Admitting: Internal Medicine

## 2022-12-31 NOTE — Telephone Encounter (Signed)
  Pt c/o medication issue:  1. Name of Medication: Evolocumab (REPATHA SURECLICK) 497 MG/ML SOAJ   2. How are you currently taking this medication (dosage and times per day)? INJECT 1ML (140MG ) SUBCUTANEOUSLY EVERY 14 DAYS   3. Are you having a reaction (difficulty breathing--STAT)? No   4. What is your medication issue? Pt said, the repatha she got from pharmacy was defective and [place] will contact Dr. Saunders Revel to give authorization for replacement. She have [facility]'s phone number  [phone]

## 2023-01-01 NOTE — Telephone Encounter (Signed)
Verbal authorization provider to pharmacist from [facility] to send repatha replacement pen to pt. Pt made aware and verbalized understanding.

## 2023-01-10 ENCOUNTER — Ambulatory Visit: Payer: Medicare HMO | Attending: Internal Medicine | Admitting: Internal Medicine

## 2023-01-10 ENCOUNTER — Encounter: Payer: Self-pay | Admitting: Internal Medicine

## 2023-01-10 ENCOUNTER — Other Ambulatory Visit
Admission: RE | Admit: 2023-01-10 | Discharge: 2023-01-10 | Disposition: A | Discharge status: Home / Self Care | Payer: Medicare HMO | Source: Ambulatory Visit | Attending: Internal Medicine | Admitting: Internal Medicine

## 2023-01-10 VITALS — BP 148/64 | HR 47 | Ht 64.0 in | Wt 141.0 lb

## 2023-01-10 DIAGNOSIS — Z79899 Other long term (current) drug therapy: Secondary | ICD-10-CM | POA: Insufficient documentation

## 2023-01-10 DIAGNOSIS — I25118 Atherosclerotic heart disease of native coronary artery with other forms of angina pectoris: Secondary | ICD-10-CM | POA: Diagnosis not present

## 2023-01-10 DIAGNOSIS — I4719 Other supraventricular tachycardia: Secondary | ICD-10-CM

## 2023-01-10 DIAGNOSIS — E782 Mixed hyperlipidemia: Secondary | ICD-10-CM

## 2023-01-10 DIAGNOSIS — I38 Endocarditis, valve unspecified: Secondary | ICD-10-CM

## 2023-01-10 DIAGNOSIS — R0989 Other specified symptoms and signs involving the circulatory and respiratory systems: Secondary | ICD-10-CM | POA: Diagnosis not present

## 2023-01-10 LAB — LIPID PANEL
Cholesterol: 135 mg/dL (ref 0–200)
HDL: 33 mg/dL — ABNORMAL LOW (ref >40)
LDL Cholesterol: 42 mg/dL (ref 0–99)
Total CHOL/HDL Ratio: 4.1 RATIO
Triglycerides: 300 mg/dL — ABNORMAL HIGH (ref <150)
VLDL: 60 mg/dL — ABNORMAL HIGH (ref 0–40)

## 2023-01-10 LAB — LDL CHOLESTEROL, DIRECT: Direct LDL: 54 mg/dL (ref 0–99)

## 2023-01-10 MED ORDER — ISOSORBIDE MONONITRATE ER 60 MG PO TB24: 60.0000 mg | ORAL_TABLET | Freq: Every day | ORAL | 3 refills | Status: DC

## 2023-01-10 NOTE — Patient Instructions (Signed)
Medication Instructions:  Your physician recommends the following medication changes.  STOP TAKING: Metoprolol   INCREASE: Imdur to 60 mg by mouth daily  *If you need a refill on your cardiac medications before your next appointment, please call your pharmacy*   Lab Work: Your provider would like for you to have following labs drawn: (Lipid, Direct LDL).   Please go to the [facility] entrance and check in at the front desk.  You do not need an appointment.  They are open from 7am-6 pm.   If you have labs (blood work) drawn today and your tests are completely normal, you will receive your results only by: [place] (if you have MyChart) OR A paper copy in the mail If you have any lab test that is abnormal or we need to change your treatment, we will call you to review the results.   Testing/Procedures: None ordered today   Follow-Up: At [facility], you and your health needs are our priority.  As part of our continuing mission to provide you with exceptional heart care, we have created designated Provider Care Teams.  These Care Teams include your primary Cardiologist (physician) and Advanced Practice Providers (APPs -  Physician Assistants and Nurse Practitioners) who all work together to provide you with the care you need, when you need it.  We recommend signing up for the patient portal called "MyChart".  Sign up information is provided on this After Visit Summary.  MyChart is used to connect with patients for Virtual Visits (Telemedicine).  Patients are able to view lab/test results, encounter notes, upcoming appointments, etc.  Non-urgent messages can be sent to your provider as well.   To learn more about what you can do with MyChart, go to [email or web address].    Your next appointment:   6 month(s)  Provider:   You may see Nelva Bush, MD or one of the following Advanced Practice Providers on your designated Care Team:   Murray Hodgkins,  NP Christell Faith, PA-C Cadence Kathlen Mody, PA-C Gerrie Nordmann, NP

## 2023-01-10 NOTE — Progress Notes (Signed)
Follow-up Outpatient Visit Date: 01/10/2023  Primary Care Provider: [facility] [place] [id number]  Chief Complaint: Follow-up coronary artery disease, valvular heart disease, and atrial tachycardia  HPI:  Debra Barry is a 72 y.o. female with history of coronary artery disease (medically managed), aortic and mitral regurgitation, paroxysmal atrial tachycardia, hypertension, hyperlipidemia, impaired glucose tolerance, and hypothyroidism, who presents for follow-up of valvular heart disease, coronary artery disease, and hypertension.  I last saw her a year ago, which time she was feeling fairly well.  She continues to be under quite a bit of stress related to her sister, who had transitioned to assisted living.  We did not make any medication changes or pursue additional testing.  Today, Debra Barry reports that she feels about the same as prior visits.  She continues to be under quite a bit of stress related to her sister.  Though her sister is in assisted living, Debra Barry is still providing a lot of care for her.  She recently injured her left shoulder trying to help transfer her sister.  Despite this, Debra Barry tries to walk regularly and go to the gym.  Her weight has been stable.  She continues to complain of feeling tired a lot.  The only time she gets short of breath is when she tries to lie flat on her back.  She feels like she does not rest well at night, largely due to stress surrounding her sister.  She thinks this is also contributing to her elevated blood pressure.  She denies chest pain, palpitations, and edema.  She notes occasional vertigo and also gets lightheaded if her systolic blood pressure falls below 120 mmHg.  It is not unusual for her heart rate to be in the mid to upper 40s, though it increases to 80 or 90 bpm when she is working out at [employer].  --------------------------------------------------------------------------------------------------  Past Medical History:  Diagnosis Date   Arthritis    Complication of anesthesia    Heart murmur    Hx of adenomatous polyp of colon 08/11/2015   Hyperlipidemia    Hypertension    Hypothyroidism    Impaired glucose tolerance    Patella fracture right    2014   Plantar fascial fibromatosis    left foot   PONV (postoperative nausea and vomiting)    Past Surgical History:  Procedure Laterality Date   [place]   benign tumor   FOOT SURGERY Left 2003   patella fracture repair Left 2007   PLANTAR FASCIA RELEASE Left 02/09/2016   Procedure: LEFT FOOT ENDOSCOPIC PLANTAR FASCIOTOMY;  Surgeon: [name], MD;  Location: [facility];  Service: Orthopedics;  Laterality: Left;   THYROIDECTOMY  1995   VAGINAL HYSTERECTOMY  1983    Current Meds  Medication Sig   amLODipine (NORVASC) 10 MG tablet Take 10 mg by mouth daily.   aspirin EC 81 MG tablet Take 81 mg by mouth daily.   cyclobenzaprine (FLEXERIL) 10 MG tablet Take 10 mg by mouth daily as needed.   Evolocumab (REPATHA SURECLICK) XX123456 MG/ML SOAJ INJECT 1ML (140MG ) SUBCUTANEOUSLY EVERY 14 DAYS   isosorbide mononitrate (IMDUR) 30 MG 24 hr tablet Take 1 tablet (30 mg total) by mouth daily.   levothyroxine (SYNTHROID, LEVOTHROID) 75 MCG tablet Take 75 mcg by mouth daily before breakfast.    metoprolol succinate (TOPROL-XL) 25 MG 24 hr tablet Take 1 tablet by  mouth daily.   multivitamin-lutein (OCUVITE-LUTEIN) CAPS capsule Take 1 capsule by mouth daily.   Omega-3 Fatty Acids (FISH OIL) 1000 MG CPDR Take 2 capsules by mouth 2 (two) times daily.   Polyvinyl Alcohol-Povidone (REFRESH OP) Apply 1 drop to eye 4 (four) times daily as needed (dry eyes).    telmisartan (MICARDIS) 80 MG tablet Take 80 mg by mouth daily.   Tetrahydrozoline HCl (EYE DROPS OP) Place 1 drop into both eyes at  bedtime as needed. Refresh eye gel    Allergies: Citric acid, Codeine, Penicillins, Statins, Tricor [fenofibrate], Verapamil, and Sulfa antibiotics  Social History   Tobacco Use   Smoking status: Former    Packs/day: .25    Types: Cigarettes    Quit date: 10/22/1974    Years since quitting: 48.2   Smokeless tobacco: Never  Vaping Use   Vaping Use: Never used  Substance Use Topics   Alcohol use: No    Alcohol/week: 0.0 standard drinks of alcohol   Drug use: No    Family History  Problem Relation Age of Onset   Hypertension Mother    Hyperlipidemia Mother    Glaucoma Mother    Stroke Mother        TIA   Rectal cancer Father    Hepatitis Father    Heart disease Maternal Grandfather    Colon cancer Neg Hx     Review of Systems: A 12-system review of systems was performed and was negative except as noted in the HPI.  --------------------------------------------------------------------------------------------------  Physical Exam: BP (!) 148/64 (BP Location: Left Arm, Patient Position: Sitting, Cuff Size: Normal)   Pulse (!) 47   Ht 5\' 4"  (1.626 m)   Wt 141 lb (64 kg)   LMP  (LMP Unknown)   SpO2 98%   BMI 24.20 kg/m   General:  NAD. Neck: No JVD or HJR. Lungs: Clear to auscultation bilaterally without wheezes or crackles. Heart: Bradycardic but regular with 2/6 systolic murmur.  No rubs or gallops. Abdomen: Soft, nontender, nondistended. Extremities: No lower extremity edema.  EKG: Sinus bradycardia with left axis deviation and borderline LVH.  No significant change since 01/10/2022.  Outside labs (11/01/2022): TSH: 0.988  Hemoglobin A1c: 5.1%  CMP: Sodium 143, potassium 4.3, chloride 104, CO2 24, BUN 28, creatinine 1.5, glucose 102, calcium 9.7, AST 24, ALT 10, alkaline phosphatase 49, total bilirubin 0.5, total protein 6.6, albumin 4.4  CBC: WBC 5.2, Hgb 12.7, HCT 37.8, PLT  221  --------------------------------------------------------------------------------------------------  ASSESSMENT AND PLAN: Coronary artery disease with stable angina: Debra Barry has not had any angina or exertional dyspnea.  We will continue medical management of her CAD with amlodipine and isosorbide mononitrate.  Given continued bradycardia and some fatigue, we have agreed to discontinue metoprolol and increase isosorbide mononitrate to 60 mg daily.  Continue evolocumab and aspirin for secondary prevention.  I will check a lipid panel and direct LDL today to ensure adequate response.  Valvular heart disease: Debra Barry appears euvolemic with stable NYHA class II symptoms.  Most recent echo a year ago showed mild aortic regurgitation and mild-moderate mitral regurgitation.  LVEF was normal.  Given stable symptoms, we will continue working on blood pressure control and consider repeating an echo in a year (sooner if new symptoms develop).  Hypertension: Blood pressure suboptimally controlled today.  As above, we will discontinue metoprolol and increase isosorbide mononitrate.  Continue current doses of amlodipine and telmisartan.  Hyperlipidemia: Continue evolocumab and fish oil with repeat lipid panel and direct  LDL today.  If triglycerides allow, we could consider discontinuation of fish oil.  However, if TG is still high, I would favor continuing this given that Debra Barry has not tolerated fibrate's in the past.  Paroxysmal atrial tachycardia: No significant palpitations reported today.  Given fatigue and bradycardia, we wil discontinue metoprolol.  If she experiences recrudescence of palpitations, we may need to resume low-dose beta-blocker versus EP consultation.  Follow-up: Return to clinic in 6 months.   Nelva Bush, MD 01/10/2023 9:58 AM

## 2023-01-12 ENCOUNTER — Encounter: Payer: Self-pay | Admitting: Internal Medicine

## 2023-01-15 DIAGNOSIS — Z1231 Encounter for screening mammogram for malignant neoplasm of breast: Secondary | ICD-10-CM | POA: Diagnosis not present

## 2023-01-23 ENCOUNTER — Other Ambulatory Visit: Payer: Self-pay | Admitting: Internal Medicine

## 2023-01-29 DIAGNOSIS — C4401 Basal cell carcinoma of skin of lip: Secondary | ICD-10-CM | POA: Diagnosis not present

## 2023-02-05 DIAGNOSIS — E782 Mixed hyperlipidemia: Secondary | ICD-10-CM | POA: Diagnosis not present

## 2023-02-05 DIAGNOSIS — I2510 Atherosclerotic heart disease of native coronary artery without angina pectoris: Secondary | ICD-10-CM | POA: Diagnosis not present

## 2023-02-05 DIAGNOSIS — R7303 Prediabetes: Secondary | ICD-10-CM | POA: Diagnosis not present

## 2023-02-05 DIAGNOSIS — N183 Chronic kidney disease, stage 3 unspecified: Secondary | ICD-10-CM | POA: Diagnosis not present

## 2023-02-05 DIAGNOSIS — E890 Postprocedural hypothyroidism: Secondary | ICD-10-CM | POA: Diagnosis not present

## 2023-02-05 DIAGNOSIS — I10 Essential (primary) hypertension: Secondary | ICD-10-CM | POA: Diagnosis not present

## 2023-03-06 DIAGNOSIS — N183 Chronic kidney disease, stage 3 unspecified: Secondary | ICD-10-CM | POA: Diagnosis not present

## 2023-03-06 DIAGNOSIS — M25532 Pain in left wrist: Secondary | ICD-10-CM | POA: Diagnosis not present

## 2023-03-13 DIAGNOSIS — M1812 Unilateral primary osteoarthritis of first carpometacarpal joint, left hand: Secondary | ICD-10-CM | POA: Diagnosis not present

## 2023-03-22 DIAGNOSIS — Z08 Encounter for follow-up examination after completed treatment for malignant neoplasm: Secondary | ICD-10-CM | POA: Diagnosis not present

## 2023-03-22 DIAGNOSIS — Z85828 Personal history of other malignant neoplasm of skin: Secondary | ICD-10-CM | POA: Diagnosis not present

## 2023-03-28 DIAGNOSIS — M1812 Unilateral primary osteoarthritis of first carpometacarpal joint, left hand: Secondary | ICD-10-CM | POA: Diagnosis not present

## 2023-04-24 DIAGNOSIS — D492 Neoplasm of unspecified behavior of bone, soft tissue, and skin: Secondary | ICD-10-CM | POA: Diagnosis not present

## 2023-04-24 DIAGNOSIS — Z961 Presence of intraocular lens: Secondary | ICD-10-CM | POA: Diagnosis not present

## 2023-04-24 DIAGNOSIS — H02051 Trichiasis without entropian right upper eyelid: Secondary | ICD-10-CM | POA: Diagnosis not present

## 2023-04-24 DIAGNOSIS — H43813 Vitreous degeneration, bilateral: Secondary | ICD-10-CM | POA: Diagnosis not present

## 2023-04-24 DIAGNOSIS — H10413 Chronic giant papillary conjunctivitis, bilateral: Secondary | ICD-10-CM | POA: Diagnosis not present

## 2023-05-17 DIAGNOSIS — N183 Chronic kidney disease, stage 3 unspecified: Secondary | ICD-10-CM | POA: Diagnosis not present

## 2023-05-17 DIAGNOSIS — R7303 Prediabetes: Secondary | ICD-10-CM | POA: Diagnosis not present

## 2023-05-17 DIAGNOSIS — T466X5A Adverse effect of antihyperlipidemic and antiarteriosclerotic drugs, initial encounter: Secondary | ICD-10-CM | POA: Diagnosis not present

## 2023-05-17 DIAGNOSIS — I10 Essential (primary) hypertension: Secondary | ICD-10-CM | POA: Diagnosis not present

## 2023-05-17 DIAGNOSIS — I2510 Atherosclerotic heart disease of native coronary artery without angina pectoris: Secondary | ICD-10-CM | POA: Diagnosis not present

## 2023-05-17 DIAGNOSIS — E782 Mixed hyperlipidemia: Secondary | ICD-10-CM | POA: Diagnosis not present

## 2023-05-17 DIAGNOSIS — M791 Myalgia, unspecified site: Secondary | ICD-10-CM | POA: Diagnosis not present

## 2023-05-17 DIAGNOSIS — E890 Postprocedural hypothyroidism: Secondary | ICD-10-CM | POA: Diagnosis not present

## 2023-06-25 DIAGNOSIS — M1812 Unilateral primary osteoarthritis of first carpometacarpal joint, left hand: Secondary | ICD-10-CM | POA: Diagnosis not present

## 2023-07-10 ENCOUNTER — Ambulatory Visit: Payer: Medicare HMO | Admitting: Internal Medicine

## 2023-07-14 ENCOUNTER — Other Ambulatory Visit: Payer: Self-pay | Admitting: Internal Medicine

## 2023-07-23 ENCOUNTER — Ambulatory Visit: Payer: Medicare HMO | Attending: Internal Medicine | Admitting: Cardiology

## 2023-07-23 ENCOUNTER — Encounter: Payer: Self-pay | Admitting: Cardiology

## 2023-07-23 VITALS — BP 138/78 | HR 56 | Ht 64.0 in | Wt 130.8 lb

## 2023-07-23 DIAGNOSIS — I38 Endocarditis, valve unspecified: Secondary | ICD-10-CM

## 2023-07-23 DIAGNOSIS — I10 Essential (primary) hypertension: Secondary | ICD-10-CM | POA: Diagnosis not present

## 2023-07-23 DIAGNOSIS — I25118 Atherosclerotic heart disease of native coronary artery with other forms of angina pectoris: Secondary | ICD-10-CM | POA: Diagnosis not present

## 2023-07-23 DIAGNOSIS — I4719 Other supraventricular tachycardia: Secondary | ICD-10-CM | POA: Diagnosis not present

## 2023-07-23 DIAGNOSIS — E782 Mixed hyperlipidemia: Secondary | ICD-10-CM

## 2023-07-23 NOTE — Patient Instructions (Addendum)
Medication Instructions:  Your physician recommends that you continue on your current medications as directed. Please refer to the Current Medication list given to you today.  *If you need a refill on your cardiac medications before your next appointment, please call your pharmacy*  Lab Work: -None ordered  Testing/Procedures: Your physician has requested that you have an echocardiogram. Echocardiography is a painless test that uses sound waves to create images of your heart. It provides your doctor with information about the size and shape of your heart and how well your heart's chambers and valves are working. This procedure takes approximately one hour. There are no restrictions for this procedure. Please do NOT wear cologne, perfume, aftershave, or lotions (deodorant is allowed). Please arrive 15 minutes prior to your appointment time.   Follow-Up: At [facility], you and your health needs are our priority.  As part of our continuing mission to provide you with exceptional heart care, we have created designated Provider Care Teams.  These Care Teams include your primary Cardiologist (physician) and Advanced Practice Providers (APPs -  Physician Assistants and Nurse Practitioners) who all work together to provide you with the care you need, when you need it.  Your next appointment:   6 month(s)  Provider:   Yvonne Kendall, MD    Other Instructions -None

## 2023-07-23 NOTE — Progress Notes (Unsigned)
Cardiology Office Note:  .   Date:  07/24/2023  ID:  Debra Barry, DOB [birth year], MRN [id number] PCP: [facility] Providers Cardiologist:  Yvonne Kendall, MD    History of Present Illness: .   Debra Barry is a 72 y.o. female with a past medical history of coronary artery disease, valvular heart disease, labile hypertension, mixed hyperlipidemia, paroxysmal atrial tachycardia, and hypothyroidism, who is here today for follow-up.  She underwent renal artery duplex in 2017 which demonstrated no evidence of renal artery stenosis with her labile hypertension.  Echocardiogram completed in 2017 revealed an EF of 60 to 65%, wall motion was normal, G2 DD, and mild to moderate aortic regurgitation with a valve area 1.93 cm.  Underwent nuclear stress testing which revealed low risk study.  Pete echocardiogram in 11/2017 revealed an LVEF of 55 to 60%, no RWMA, moderate regurgitation and mild stenosis of the aortic valve and mild mitral regurgitation, aortic valve area 1.52 cm.  Echo in 2021 revealed EF 55 to 60%, no RWMA, G2 DD, mild mitral valve regurgitation, mild aortic regurgitation and mild aortic valve stenosis.  She was last seen in clinic 01/10/2023 by Dr End.  She continued to be under quite a bit of stress related to her sister, that even though she is now in assisted living, she was still providing a lot of care for her.  She is also sustained a left shoulder injury.  She continued to go to the gym regularly.  Stated the only time that she would get short of breath is when she would lie flat on her back.  She feels like she does not rest well at night largely due to stress surrounding her sister.  She also stated that she would become lightheaded and have been occasional vertigo if her blood pressure falls below 120 mg mercury.  She returns to clinic today stating that overall she has done fairly well.  She continues to have shortness of breath when she  lies flat on her back in bed.  Unfortunately she is unable to lay on her left side due to previous left shoulder injury and now she continues to be in a left wrist splint.  She states that she has to sleep on her right side.  She states that she has been compliant with her current medication regimen without side effects.  She denies any hospitalizations or visits to the emergency department.  ROS: 10 point review of systems has been reviewed and considered negative with exception of what is been listed in the HPI  Studies Reviewed: Marland Kitchen   EKG Interpretation Date/Time:  Tuesday July 23 2023 14:46:58 EDT Ventricular Rate:  56 PR Interval:  154 QRS Duration:  76 QT Interval:  422 QTC Calculation: 407 R Axis:   -45  Text Interpretation: Sinus bradycardia Left anterior fascicular block Possible Anterior infarct , age undetermined When compared with ECG of 06-Feb-2016 09:45, No significant change was found Confirmed by Charlsie Quest ([id number]) on 07/23/2023 2:52:45 PM   TEE 12/2019 1. Left ventricular ejection fraction, by estimation, is 55 to 60%. The  left ventricle has normal function. The left ventricle has no regional  wall motion abnormalities. Left ventricular diastolic parameters are  consistent with Grade II diastolic  dysfunction (pseudonormalization).   2. Right ventricular systolic function is normal. The right ventricular  size is normal.   3. The mitral valve is grossly normal. Mild mitral valve regurgitation.   4. The aortic  valve is tricuspid. Aortic valve regurgitation is mild.  Mild aortic valve sclerosis is present, with no evidence of aortic valve  stenosis.   5. A large cystic structure noted in the liver measuring 4.4 x 4.1 cm.  Recommend dedicated abdominal/hepatic ultrasound for further  characterization.. The inferior vena cava is normal in size with greater  than 50% respiratory variability, suggesting  right atrial pressure of 3 mmHg.   Renal Artery Duplex  05/2016 IMPRESSION: No evidence of hydronephrosis.   Duplex demonstrates no evidence of renal artery stenosis.   The left kidney size measures small, 8.6 cm.   Hyperechoic focus at the inferior right corticomedullary junction may represent vascular calcification or nonobstructive stone. Risk Assessment/Calculations:             Physical Exam:   VS:  BP 138/78 (BP Location: Left Arm, Patient Position: Sitting, Cuff Size: Normal)   Pulse (!) 56   Ht 5\' 4"  (1.626 m)   Wt 130 lb 12.8 oz (59.3 kg)   LMP  (LMP Unknown)   SpO2 99%   BMI 22.45 kg/m    Wt Readings from Last 3 Encounters:  07/23/23 130 lb 12.8 oz (59.3 kg)  01/10/23 141 lb (64 kg)  01/10/22 145 lb (65.8 kg)    GEN: Well nourished, well developed in no acute distress NECK: No JVD; No carotid bruits CARDIAC: RRR, bradycardiac,  II-III/VI systolic murmur that radiates into the bilateral carotids, without rubs or gallops RESPIRATORY:  Clear to auscultation without rales, wheezing or rhonchi  ABDOMEN: Soft, non-tender, non-distended EXTREMITIES:  No edema; No deformity, splint to the left wrist.  ASSESSMENT AND PLAN: .   Coronary artery disease of native coronary artery with stable angina.  She has not had any anginal or anginal equivalents.  EKG today reveals sinus bradycardia with a left anterior fascicular block with no acute changes noted or concerns for ischemia.  She is continued on amlodipine and isosorbide mononitrate.  She is also continued on Repatha and aspirin for secondary prevention.  Hypertension with a blood pressure today 138/78.  Patient states that slightly elevated as to what she has been running at home 120-130 mmHg.  She is continued on amlodipine 10 mg daily, Imdur 60 mg daily and telmisartan 80 mg daily.  She has been encouraged to continue to monitor her pressure at home 1 to 2 hours postmedication administration as well.  Valvular heart disease with physical exam notable for heart murmur.  She does  appear to be euvolemic on exam with stable NYHA class I-II symptoms.  Given that she has shortness of breath when lying down her last echocardiogram was completed a year and a half ago.  Will go ahead and order an echo for follow-up today.  Hyperlipidemia with last LDL of 42 but unfortunately triglycerides were 300.  She is continued on Repatha and fish oil.  Unfortunately her triglycerides are still elevated and since she has not tolerated fibrates in the past she will continue on her current dosing of fish oil.  On return can rediscuss the possibility of her starting Vascepa 2 g twice daily as her triglycerides remain greater then 150 with a history of coronary artery disease.  Paroxysmal atrial tachycardia with no continued complaints of palpitations.  She continues to have bradycardia even with previously had been metoprolol discontinued at her last visit.  If she has a reoccurrence of palpitations it is previously been discussed the resumption of low-dose of beta-blocker versus EP consultation.  Dispo: Patient to return to clinic to see MD/APP in 6 months or sooner if needed  Signed, Jaeleen Inzunza, NP

## 2023-08-07 ENCOUNTER — Other Ambulatory Visit: Payer: Self-pay | Admitting: Internal Medicine

## 2023-08-13 ENCOUNTER — Ambulatory Visit: Payer: Medicare HMO | Attending: Cardiology

## 2023-08-13 DIAGNOSIS — I25118 Atherosclerotic heart disease of native coronary artery with other forms of angina pectoris: Secondary | ICD-10-CM | POA: Diagnosis not present

## 2023-08-13 LAB — ECHOCARDIOGRAM COMPLETE
Area-P 1/2: 3.03 cm2
P 1/2 time: 692 ms
S' Lateral: 2.8 cm

## 2023-08-13 NOTE — Progress Notes (Signed)
Heart squeeze 60-65%, no wall motion abnormalities noted, there is some muscle stiffness that is noted likely related to age or blood pressure.  There is also moderate leakage noted in the mitral valve and mild to moderate aortic valve leakage.  Overall study is unchanged from prior study in 2023.

## 2023-08-22 DIAGNOSIS — E782 Mixed hyperlipidemia: Secondary | ICD-10-CM | POA: Diagnosis not present

## 2023-08-22 DIAGNOSIS — I10 Essential (primary) hypertension: Secondary | ICD-10-CM | POA: Diagnosis not present

## 2023-08-22 DIAGNOSIS — R7303 Prediabetes: Secondary | ICD-10-CM | POA: Diagnosis not present

## 2023-08-22 DIAGNOSIS — E890 Postprocedural hypothyroidism: Secondary | ICD-10-CM | POA: Diagnosis not present

## 2023-08-22 DIAGNOSIS — N183 Chronic kidney disease, stage 3 unspecified: Secondary | ICD-10-CM | POA: Diagnosis not present

## 2023-08-22 DIAGNOSIS — Z9181 History of falling: Secondary | ICD-10-CM | POA: Diagnosis not present

## 2023-08-22 DIAGNOSIS — T466X5A Adverse effect of antihyperlipidemic and antiarteriosclerotic drugs, initial encounter: Secondary | ICD-10-CM | POA: Diagnosis not present

## 2023-08-22 DIAGNOSIS — M791 Myalgia, unspecified site: Secondary | ICD-10-CM | POA: Diagnosis not present

## 2023-08-22 DIAGNOSIS — I2510 Atherosclerotic heart disease of native coronary artery without angina pectoris: Secondary | ICD-10-CM | POA: Diagnosis not present

## 2023-09-13 DIAGNOSIS — Z08 Encounter for follow-up examination after completed treatment for malignant neoplasm: Secondary | ICD-10-CM | POA: Diagnosis not present

## 2023-09-13 DIAGNOSIS — L718 Other rosacea: Secondary | ICD-10-CM | POA: Diagnosis not present

## 2023-09-13 DIAGNOSIS — Z85828 Personal history of other malignant neoplasm of skin: Secondary | ICD-10-CM | POA: Diagnosis not present

## 2023-09-25 DIAGNOSIS — M1812 Unilateral primary osteoarthritis of first carpometacarpal joint, left hand: Secondary | ICD-10-CM | POA: Diagnosis not present

## 2023-10-08 DIAGNOSIS — Z9181 History of falling: Secondary | ICD-10-CM | POA: Diagnosis not present

## 2023-10-08 DIAGNOSIS — Z1331 Encounter for screening for depression: Secondary | ICD-10-CM | POA: Diagnosis not present

## 2023-10-08 DIAGNOSIS — Z0000 Encounter for general adult medical examination without abnormal findings: Secondary | ICD-10-CM | POA: Diagnosis not present

## 2023-10-08 DIAGNOSIS — Z139 Encounter for screening, unspecified: Secondary | ICD-10-CM | POA: Diagnosis not present

## 2023-11-26 DIAGNOSIS — E782 Mixed hyperlipidemia: Secondary | ICD-10-CM | POA: Diagnosis not present

## 2023-11-26 DIAGNOSIS — I10 Essential (primary) hypertension: Secondary | ICD-10-CM | POA: Diagnosis not present

## 2023-11-26 DIAGNOSIS — T466X5A Adverse effect of antihyperlipidemic and antiarteriosclerotic drugs, initial encounter: Secondary | ICD-10-CM | POA: Diagnosis not present

## 2023-11-26 DIAGNOSIS — Z1211 Encounter for screening for malignant neoplasm of colon: Secondary | ICD-10-CM | POA: Diagnosis not present

## 2023-11-26 DIAGNOSIS — L729 Follicular cyst of the skin and subcutaneous tissue, unspecified: Secondary | ICD-10-CM | POA: Diagnosis not present

## 2023-11-26 DIAGNOSIS — E890 Postprocedural hypothyroidism: Secondary | ICD-10-CM | POA: Diagnosis not present

## 2023-11-26 DIAGNOSIS — I2510 Atherosclerotic heart disease of native coronary artery without angina pectoris: Secondary | ICD-10-CM | POA: Diagnosis not present

## 2023-11-26 DIAGNOSIS — R7303 Prediabetes: Secondary | ICD-10-CM | POA: Diagnosis not present

## 2023-11-26 DIAGNOSIS — N183 Chronic kidney disease, stage 3 unspecified: Secondary | ICD-10-CM | POA: Diagnosis not present

## 2023-11-26 DIAGNOSIS — M791 Myalgia, unspecified site: Secondary | ICD-10-CM | POA: Diagnosis not present

## 2023-12-06 DIAGNOSIS — Z85828 Personal history of other malignant neoplasm of skin: Secondary | ICD-10-CM | POA: Diagnosis not present

## 2023-12-06 DIAGNOSIS — L570 Actinic keratosis: Secondary | ICD-10-CM | POA: Diagnosis not present

## 2023-12-06 DIAGNOSIS — Z08 Encounter for follow-up examination after completed treatment for malignant neoplasm: Secondary | ICD-10-CM | POA: Diagnosis not present

## 2023-12-06 DIAGNOSIS — X32XXXD Exposure to sunlight, subsequent encounter: Secondary | ICD-10-CM | POA: Diagnosis not present

## 2023-12-12 DIAGNOSIS — L729 Follicular cyst of the skin and subcutaneous tissue, unspecified: Secondary | ICD-10-CM | POA: Diagnosis not present

## 2023-12-20 DIAGNOSIS — H9319 Tinnitus, unspecified ear: Secondary | ICD-10-CM | POA: Diagnosis not present

## 2023-12-20 DIAGNOSIS — I10 Essential (primary) hypertension: Secondary | ICD-10-CM | POA: Diagnosis not present

## 2023-12-20 DIAGNOSIS — Z4802 Encounter for removal of sutures: Secondary | ICD-10-CM | POA: Diagnosis not present

## 2023-12-29 ENCOUNTER — Other Ambulatory Visit: Payer: Self-pay | Admitting: Internal Medicine

## 2024-01-01 ENCOUNTER — Encounter: Payer: Self-pay | Admitting: Internal Medicine

## 2024-01-08 ENCOUNTER — Ambulatory Visit: Payer: Medicare HMO | Admitting: Internal Medicine

## 2024-01-21 DIAGNOSIS — Z1231 Encounter for screening mammogram for malignant neoplasm of breast: Secondary | ICD-10-CM | POA: Diagnosis not present

## 2024-01-21 DIAGNOSIS — M8588 Other specified disorders of bone density and structure, other site: Secondary | ICD-10-CM | POA: Diagnosis not present

## 2024-01-21 DIAGNOSIS — M810 Age-related osteoporosis without current pathological fracture: Secondary | ICD-10-CM | POA: Diagnosis not present

## 2024-01-21 DIAGNOSIS — Z8262 Family history of osteoporosis: Secondary | ICD-10-CM | POA: Diagnosis not present

## 2024-01-24 ENCOUNTER — Other Ambulatory Visit: Payer: Self-pay | Admitting: Internal Medicine

## 2024-01-27 ENCOUNTER — Ambulatory Visit (AMBULATORY_SURGERY_CENTER)

## 2024-01-27 ENCOUNTER — Telehealth: Payer: Self-pay

## 2024-01-27 VITALS — Ht 64.0 in | Wt 130.0 lb

## 2024-01-27 DIAGNOSIS — Z800 Family history of malignant neoplasm of digestive organs: Secondary | ICD-10-CM

## 2024-01-27 DIAGNOSIS — Z860100 Personal history of colon polyps, unspecified: Secondary | ICD-10-CM

## 2024-01-27 NOTE — Progress Notes (Signed)
 No egg or soy allergy known to patient  No issues known to pt with past sedation with any surgeries or procedures Patient denies ever being told they had issues or difficulty with intubation  No FH of Malignant Hyperthermia Pt is not on diet pills Pt is not on  home 02  Pt is not on blood thinners  Pt denies issues with constipation  No A fib or A flutter Have any cardiac testing pending--Routine appt Cardiologist on 4/15 Pt can ambulate  Pt denies use of chewing tobacco Discussed diabetic I weight loss medication holds Discussed NSAID holds Checked BMI Pt instructed to use [employer] or GoodRx for a price reduction on prep  Pre visit completed

## 2024-02-04 ENCOUNTER — Ambulatory Visit: Attending: Cardiology | Admitting: Cardiology

## 2024-02-04 ENCOUNTER — Encounter: Payer: Self-pay | Admitting: Cardiology

## 2024-02-04 VITALS — BP 138/64 | HR 55 | Ht 64.0 in | Wt 132.0 lb

## 2024-02-04 DIAGNOSIS — N184 Chronic kidney disease, stage 4 (severe): Secondary | ICD-10-CM | POA: Diagnosis not present

## 2024-02-04 DIAGNOSIS — I25118 Atherosclerotic heart disease of native coronary artery with other forms of angina pectoris: Secondary | ICD-10-CM

## 2024-02-04 DIAGNOSIS — N1832 Chronic kidney disease, stage 3b: Secondary | ICD-10-CM | POA: Diagnosis not present

## 2024-02-04 DIAGNOSIS — I4719 Other supraventricular tachycardia: Secondary | ICD-10-CM | POA: Diagnosis not present

## 2024-02-04 DIAGNOSIS — R0989 Other specified symptoms and signs involving the circulatory and respiratory systems: Secondary | ICD-10-CM

## 2024-02-04 DIAGNOSIS — I38 Endocarditis, valve unspecified: Secondary | ICD-10-CM

## 2024-02-04 DIAGNOSIS — E782 Mixed hyperlipidemia: Secondary | ICD-10-CM

## 2024-02-04 NOTE — Progress Notes (Signed)
 Cardiology Office Note:  .   Date:  02/04/2024  ID:  Debra Barry, DOB Oct 31, 1950, MRN [id number] PCP: Ailene Ravel, MD  [facility] Providers Cardiologist:  Yvonne Kendall, MD    History of Present Illness: .   Debra Barry is a 73 y.o. female with a past medical history of coronary artery disease, valvular disease, labile hypertension, mixed hyperlipidemia, paroxysmal atrial tachycardia, and hypothyroidism, who is here today to follow-up on her coronary artery disease.   She underwent renal artery duplex in 2017 which demonstrated no evidence of renal artery stenosis with her labile hypertension.  Echocardiogram completed in 2017 revealed an EF of 60 to 65%, wall motion was normal, G2 DD, and mild to moderate aortic regurgitation with a valve area 1.93 cm.  Underwent nuclear stress testing which revealed low risk study.  Pete echocardiogram in 11/2017 revealed an LVEF of 55 to 60%, no RWMA, moderate regurgitation and mild stenosis of the aortic valve and mild mitral regurgitation, aortic valve area 1.52 cm.  Echo in 2021 revealed EF 55 to 60%, no RWMA, G2 DD, mild mitral valve regurgitation, mild aortic regurgitation and mild aortic valve stenosis.   She was seen in clinic 01/10/2023 by Dr End.  She continued to be under quite a bit of stress related to her sister, that even though she is now in assisted living, she was still providing a lot of care for her.  She is also sustained a left shoulder injury.  She continued to go to the gym regularly.  Stated the only time that she would get short of breath is when she would lie flat on her back.  She feels like she does not rest well at night largely due to stress surrounding her sister.  She also stated that she would become lightheaded and have been occasional vertigo if her blood pressure falls below 120 mmHg.   She was last seen in clinic 07/23/2023 and overall been doing fairly well.  She continues to have occasional shortness of  breath when she lies flat on her back in bed.  She was continued on her current medication regimen but was scheduled for an updated echocardiogram.  She returns to clinic today stating overall from the cardiac perspective she has been doing fairly well.  She continues to have shortness of breath when she lays flat on her back in bed and she sleeps on her side without incident.  That is not changed.  She states that she continues to care for his sister.  He has an upcoming colonoscopy scheduled as well.  States that she has been compliant with her current medication regimen without adverse effects.  Denies any chest pain, palpitations, peripheral edema.  Denies any hospitalizations or visits to the emergency department.  ROS: 10 point review of systems has been reviewed and considered negative except what is listed in HPI  Studies Reviewed: Marland Kitchen   EKG Interpretation Date/Time:  Tuesday February 04 2024 10:00:44 EDT Ventricular Rate:  52 PR Interval:  158 QRS Duration:  78 QT Interval:  450 QTC Calculation: 418 R Axis:   -52  Text Interpretation: Sinus bradycardia Left anterior fascicular block When compared with ECG of 23-Jul-2023 14:46, No significant change was found Confirmed by Charlsie Quest ([id number]) on 02/04/2024 10:06:13 AM    2D echo 08/13/2023 1. Left ventricular ejection fraction, by estimation, is 60 to 65%. The  left ventricle has normal function. The left ventricle has no regional  wall motion abnormalities.  Left ventricular diastolic parameters are  consistent with Grade I diastolic  dysfunction (impaired relaxation). The average left ventricular global  longitudinal strain is -21.0 %.   2. Right ventricular systolic function is normal. The right ventricular  size is normal. There is normal pulmonary artery systolic pressure. The  estimated right ventricular systolic pressure is 33.5 mmHg.   3. The mitral valve is normal in structure. Moderate mitral valve  regurgitation. No  evidence of mitral stenosis.   4. The aortic valve is normal in structure. There is mild calcification  of the aortic valve. Aortic valve regurgitation is mild to moderate.  Aortic valve sclerosis is present, with no evidence of aortic valve  stenosis.   5. There is mild dilatation of the ascending aorta, measuring 40 mm.   6. The inferior vena cava is normal in size with greater than 50%  respiratory variability, suggesting right atrial pressure of 3 mmHg.   TEE 12/2019 1. Left ventricular ejection fraction, by estimation, is 55 to 60%. The  left ventricle has normal function. The left ventricle has no regional  wall motion abnormalities. Left ventricular diastolic parameters are  consistent with Grade II diastolic  dysfunction (pseudonormalization).   2. Right ventricular systolic function is normal. The right ventricular  size is normal.   3. The mitral valve is grossly normal. Mild mitral valve regurgitation.   4. The aortic valve is tricuspid. Aortic valve regurgitation is mild.  Mild aortic valve sclerosis is present, with no evidence of aortic valve  stenosis.   5. A large cystic structure noted in the liver measuring 4.4 x 4.1 cm.  Recommend dedicated abdominal/hepatic ultrasound for further  characterization.. The inferior vena cava is normal in size with greater  than 50% respiratory variability, suggesting  right atrial pressure of 3 mmHg.    Renal Artery Duplex 05/2016 IMPRESSION: No evidence of hydronephrosis.   Duplex demonstrates no evidence of renal artery stenosis.   The left kidney size measures small, 8.6 cm.   Hyperechoic focus at the inferior right corticomedullary junction may represent vascular calcification or nonobstructive stone. Risk Assessment/Calculations:             Physical Exam:   VS:  BP 138/64 (BP Location: Left Arm, Patient Position: Sitting, Cuff Size: Normal)   Pulse (!) 55   Ht 5\' 4"  (1.626 m)   Wt 132 lb (59.9 kg)   LMP  (LMP  Unknown)   SpO2 96%   BMI 22.66 kg/m    Wt Readings from Last 3 Encounters:  02/04/24 132 lb (59.9 kg)  01/27/24 130 lb (59 kg)  07/23/23 130 lb 12.8 oz (59.3 kg)    GEN: Well nourished, well developed in no acute distress NECK: No JVD; No carotid bruits, heart murmur that radiates into the bilateral carotids CARDIAC: RRR, bradycardic, III/VI systolic murmur, without rubs or gallops RESPIRATORY:  Clear to auscultation without rales, wheezing or rhonchi  ABDOMEN: Soft, non-tender, non-distended EXTREMITIES:  No edema; No deformity   ASSESSMENT AND PLAN: .   Coronary artery disease of native coronary artery with stable angina.  She denies any dyspnea or anginal equivalent.  EKG today reveals sinus bradycardia with a rate of 52 with a left anterior fascicular block with no acute change from prior studies.  There are also noted concerns for ischemia.  She is continued on aspirin 81 mg daily, Repatha 140 mg every 2-week injection.  Imdur 60 mg daily.  Labile hypertension with a blood pressure today 150/52 and  a recheck of 138/64.  He is continued on amlodipine 5 mg daily and Imdur 60 mg daily as well as telmisartan 80 mg daily.  States that her blood pressure is typically up when she has not slept well the night before and with caring for her sister she has been up and down.  She has been encouraged to continue to monitor pressures 1 to 2 hours postmedication administration at home as well.  Valvular heart disease with notable heart murmur on exam.  She continues to be euvolemic with stable NYHA class I symptoms.  Echocardiogram was completed in October 2024 revealed an LVEF of 60 to 65%, no RWMA, G1 DD, right ventricular systolic function and size were normal, mild mitral valve regurgitation, mild aortic valve sclerosis without stenosis.  Will continue with surveillance studies.  Mixed hyperlipidemia last LDL 101.  She is continued on Repatha 140 mg every 2 weeks.  We have requested labs from her  PCP.  Goal of LDL is less than 70 ideally 55.  Paroxysmal atrial tachycardia with no complaints of palpitations.  She does have a bradycardia had previously been on metoprolol that has been discontinued.  CKD stage IIIA serum creatinine 1.7.  She has been encouraged to avoid NSAIDs.  Serum creatinine has remained stable.  Ongoing management per PCP.       Dispo: Patient return to clinic to see MD/APP in 6 months or sooner if needed.  Signed, Agapito Hanway, NP

## 2024-02-04 NOTE — Patient Instructions (Signed)
 Medication Instructions:  Your physician recommends that you continue on your current medications as directed. Please refer to the Current Medication list given to you today.    *If you need a refill on your cardiac medications before your next appointment, please call your pharmacy*   Lab Work: None    If you have labs (blood work) drawn today and your tests are completely normal, you will receive your results only by: MyChart Message (if you have MyChart) OR A paper copy in the mail If you have any lab test that is abnormal or we need to change your treatment, we will call you to review the results.   Testing/Procedures: None    Follow-Up: At [facility], you and your health needs are our priority.  As part of our continuing mission to provide you with exceptional heart care, we have created designated Provider Care Teams.  These Care Teams include your primary Cardiologist (physician) and Advanced Practice Providers (APPs -  Physician Assistants and Nurse Practitioners) who all work together to provide you with the care you need, when you need it.  We recommend signing up for the patient portal called "MyChart".  Sign up information is provided on this After Visit Summary.  MyChart is used to connect with patients for Virtual Visits (Telemedicine).  Patients are able to view lab/test results, encounter notes, upcoming appointments, etc.  Non-urgent messages can be sent to your provider as well.   To learn more about what you can do with MyChart, go to [email or web address].    Your next appointment:   6 month(s)  The format for your next appointment:   In Person  Provider:   You will see one of the following Advanced Practice Providers on your designated Care Team:   Laneta Pintos, NP Gildardo Labrador, PA-C Varney Gentleman, PA-C Cadence Yermo, PA-C Ronald Cockayne, NP Morey Ar, NP    Other Instructions

## 2024-02-12 ENCOUNTER — Encounter: Payer: Self-pay | Admitting: Internal Medicine

## 2024-02-17 ENCOUNTER — Telehealth: Payer: Self-pay | Admitting: Internal Medicine

## 2024-02-17 NOTE — Telephone Encounter (Signed)
 Pt made aware and verbalized understanding.   Pavero, Christopher, [facility]  You12 minutes ago (10:50 AM)    Will complete replacement form when it comes in via onbase

## 2024-02-17 NOTE — Progress Notes (Unsigned)
 [facility] History and Physical   Primary Care Physician:  Hamrick, Orest Bio, MD   Reason for Procedure:  History of colon adenoma and family history of colon cancer  Plan:    Colonoscopy     HPI: Debra Barry is a 73 y.o. female with a personal history of an adenomatous colon polyp and family history as outlined below.  She presents for a surveillance exam.  Diminutive adenoma + FHx CRCA father and sister Repeat colonoscopy 2021 recommended. Past Medical History:  Diagnosis Date   Arthritis    Chronic kidney disease    Complication of anesthesia    Heart murmur    Hx of adenomatous polyp of colon 08/11/2015   Hyperlipidemia    Hypertension    Hypothyroidism    Impaired glucose tolerance    Patella fracture right    2014   Plantar fascial fibromatosis    left foot   PONV (postoperative nausea and vomiting)     Past Surgical History:  Procedure Laterality Date   APPENDECTOMY  10/23/1979   CATARACT EXTRACTION Bilateral    2023   COLON RESECTION  10/23/1983   benign tumor   FOOT SURGERY Left 10/22/2001   patella fracture repair Left 10/22/2005   PLANTAR FASCIA RELEASE Left 02/09/2016   Procedure: LEFT FOOT ENDOSCOPIC PLANTAR FASCIOTOMY;  Surgeon: Ferd Householder, MD;  Location: [facility];  Service: Orthopedics;  Laterality: Left;   THYROIDECTOMY  10/22/1993   VAGINAL HYSTERECTOMY  10/22/1981   partial and total    Prior to Admission medications   Medication Sig Start Date End Date Taking? Authorizing Provider  amLODipine  (NORVASC ) 5 MG tablet Take 10 mg by mouth daily. 07/14/23   [provider]  aspirin  EC 81 MG tablet Take 81 mg by mouth daily.    [provider]  cyclobenzaprine (FLEXERIL) 10 MG tablet Take 10 mg by mouth daily as needed. 02/05/17   [provider]  Evolocumab  (REPATHA  SURECLICK) 140 MG/ML SOAJ INJECT 1 ML INTO THE SKIN EVERY 14 DAYS 12/30/23   End, Veryl Gottron, MD  isosorbide  mononitrate  (IMDUR ) 60 MG 24 hr tablet Take 1 tablet by mouth once daily 01/24/24   Ronald Cockayne, NP  levothyroxine (SYNTHROID, LEVOTHROID) 75 MCG tablet Take 75 mcg by mouth daily before breakfast.  12/16/17   [provider]  multivitamin-lutein (OCUVITE-LUTEIN) CAPS capsule Take 1 capsule by mouth daily.    [provider]  Omega-3 Fatty Acids (FISH OIL) 1000 MG CPDR Take 2 capsules by mouth 2 (two) times daily. 03/06/18   End, Veryl Gottron, MD  Polyvinyl Alcohol-Povidone (REFRESH OP) Apply 1 drop to eye 4 (four) times daily as needed (dry eyes).     [provider]  telmisartan (MICARDIS) 80 MG tablet Take 80 mg by mouth daily.    [provider]  Tetrahydrozoline HCl (EYE DROPS OP) Place 1 drop into both eyes at bedtime as needed. Refresh eye gel    [provider]    Current Outpatient Medications  Medication Sig Dispense Refill   amLODipine  (NORVASC ) 5 MG tablet Take 10 mg by mouth daily.     aspirin  EC 81 MG tablet Take 81 mg by mouth daily.     cyclobenzaprine (FLEXERIL) 10 MG tablet Take 10 mg by mouth daily as needed.     Evolocumab  (REPATHA  SURECLICK) 140 MG/ML SOAJ INJECT 1 ML INTO THE SKIN EVERY 14 DAYS 2 mL 11   isosorbide  mononitrate (IMDUR ) 60 MG 24 hr tablet  Take 1 tablet by mouth once daily 90 tablet 0   levothyroxine (SYNTHROID, LEVOTHROID) 75 MCG tablet Take 75 mcg by mouth daily before breakfast.      multivitamin-lutein (OCUVITE-LUTEIN) CAPS capsule Take 1 capsule by mouth daily.     Omega-3 Fatty Acids (FISH OIL) 1000 MG CPDR Take 2 capsules by mouth 2 (two) times daily.     Polyvinyl Alcohol-Povidone (REFRESH OP) Apply 1 drop to eye 4 (four) times daily as needed (dry eyes).      telmisartan (MICARDIS) 80 MG tablet Take 80 mg by mouth daily.     Tetrahydrozoline HCl (EYE DROPS OP) Place 1 drop into both eyes at bedtime as needed. Refresh eye gel     No current facility-administered medications for this visit.    Allergies as of  02/18/2024 - Review Complete 02/04/2024  Allergen Reaction Noted   Penicillins Hives 07/20/2015   Citric acid Rash 05/27/2019   Codeine Nausea And Vomiting 07/20/2015   Other Nausea And Vomiting 07/23/2023   Statins Other (See Comments) 01/27/2018   Sulfa antibiotics Rash 07/20/2015   Tricor [fenofibrate] Other (See Comments) 07/20/2015   Verapamil Other (See Comments) 07/20/2015    Family History  Problem Relation Age of Onset   Hypertension Mother    Hyperlipidemia Mother    Glaucoma Mother    Stroke Mother        TIA   Rectal cancer Father    Hepatitis Father    Colon cancer Sister    Heart disease Maternal Grandfather    Stomach cancer Neg Hx    Esophageal cancer Neg Hx     Social History   Socioeconomic History   Marital status: Divorced    Spouse name: Not on file   Number of children: Not on file   Years of education: Not on file   Highest education level: Not on file  Occupational History   Not on file  Tobacco Use   Smoking status: Former    Current packs/day: 0.00    Types: Cigarettes    Quit date: 10/22/1974    Years since quitting: 49.3   Smokeless tobacco: Never  Vaping Use   Vaping status: Never Used  Substance and Sexual Activity   Alcohol use: No    Alcohol/week: 0.0 standard drinks of alcohol   Drug use: No   Sexual activity: Not on file  Other Topics Concern   Not on file  Social History Narrative   Not on file   Social Drivers of Health   Financial Resource Strain: Not on file  Food Insecurity: Not on file  Transportation Needs: Not on file  Physical Activity: Not on file  Stress: Not on file  Social Connections: Not on file  Intimate Partner Violence: Not on file    Review of Systems: Positive for *** All other review of systems negative except as mentioned in the HPI.  Physical Exam: Vital signs LMP  (LMP Unknown)   General:   Alert,  Well-developed, well-nourished, pleasant and cooperative in NAD Lungs:  Clear throughout  to auscultation.   Heart:  Regular rate and rhythm; no murmurs, clicks, rubs,  or gallops. Abdomen:  Soft, nontender and nondistended. Normal bowel sounds.   Neuro/Psych:  Alert and cooperative. Normal mood and affect. A and O x 3   @Debra Lastinger  Tammie Fall, MD, [facility] [phone] (pager) 02/17/2024 7:18 PM@

## 2024-02-17 NOTE — Telephone Encounter (Signed)
 Pt c/o medication issue:  1. Name of Medication:  Evolocumab  (REPATHA  SURECLICK) 140 MG/ML SOAJ   2. How are you currently taking this medication (dosage and times per day)? Was supposed be taken Saturday   3. Are you having a reaction (difficulty breathing--STAT)? No   4. What is your medication issue? Patient is calling stating medication did not eject when attempted injection was made Saturday. She reports she contacted the company and was advised to let Dr. Nolan Battle know. They will be faxing or call regarding this for the prescription number and approval. Patient states the prescription number for 03/12 on April and May injection is [id number]. Please advise.

## 2024-02-18 ENCOUNTER — Ambulatory Visit: Admitting: Internal Medicine

## 2024-02-18 ENCOUNTER — Encounter: Payer: Self-pay | Admitting: Internal Medicine

## 2024-02-18 VITALS — BP 141/62 | HR 56 | Temp 98.4°F | Resp 10 | Ht 64.0 in | Wt 128.8 lb

## 2024-02-18 DIAGNOSIS — E039 Hypothyroidism, unspecified: Secondary | ICD-10-CM | POA: Diagnosis not present

## 2024-02-18 DIAGNOSIS — Z1211 Encounter for screening for malignant neoplasm of colon: Secondary | ICD-10-CM | POA: Diagnosis not present

## 2024-02-18 DIAGNOSIS — K5730 Diverticulosis of large intestine without perforation or abscess without bleeding: Secondary | ICD-10-CM | POA: Diagnosis not present

## 2024-02-18 DIAGNOSIS — Z800 Family history of malignant neoplasm of digestive organs: Secondary | ICD-10-CM | POA: Diagnosis not present

## 2024-02-18 DIAGNOSIS — Z860101 Personal history of adenomatous and serrated colon polyps: Secondary | ICD-10-CM

## 2024-02-18 DIAGNOSIS — D124 Benign neoplasm of descending colon: Secondary | ICD-10-CM

## 2024-02-18 DIAGNOSIS — I10 Essential (primary) hypertension: Secondary | ICD-10-CM | POA: Diagnosis not present

## 2024-02-18 DIAGNOSIS — N189 Chronic kidney disease, unspecified: Secondary | ICD-10-CM | POA: Diagnosis not present

## 2024-02-18 MED ORDER — SODIUM CHLORIDE 0.9 % IV SOLN: 500.0000 mL | INTRAVENOUS | Status: DC

## 2024-02-18 NOTE — Op Note (Signed)
 [facility] Patient Name: Debra Barry Procedure Date: 02/18/2024 12:13 PM MRN: [id number] Endoscopist: Kenney Peacemaker , MD, [id number] Age: 73 Referring MD:  Date of Birth: 18-Feb-1951 Gender: Female Account #: [id number] Procedure:                Colonoscopy Indications:              High risk colon cancer surveillance: Personal                            history of colonic polyps, Last colonoscopy: 2016                            also w/ fHx CRCA father and sister Medicines:                Monitored Anesthesia Care Procedure:                Pre-Anesthesia Assessment:                           - Prior to the procedure, a History and Physical                            was performed, and patient medications and                            allergies were reviewed. The patient's tolerance of                            previous anesthesia was also reviewed. The risks                            and benefits of the procedure and the sedation                            options and risks were discussed with the patient.                            All questions were answered, and informed consent                            was obtained. Prior Anticoagulants: The patient has                            taken no anticoagulant or antiplatelet agents. ASA                            Grade Assessment: III - A patient with severe                            systemic disease. After reviewing the risks and                            benefits, the patient was deemed in satisfactory  condition to undergo the procedure.                           After obtaining informed consent, the colonoscope                            was passed under direct vision. Throughout the                            procedure, the patient's blood pressure, pulse, and                            oxygen saturations were monitored continuously. The                            Olympus Scope SN  [id number] was introduced through the                            anus and advanced to the the cecum, identified by                            appendiceal orifice and ileocecal valve. The                            colonoscopy was performed without difficulty. The                            patient tolerated the procedure well. The quality                            of the bowel preparation was good. The ileocecal                            valve, appendiceal orifice, and rectum were                            photographed. Scope In: 12:18:34 PM Scope Out: 12:34:36 PM Scope Withdrawal Time: 0 hours 8 minutes 33 seconds  Total Procedure Duration: 0 hours 16 minutes 2 seconds  Findings:                 The perianal and digital rectal examinations were                            normal.                           A diminutive polyp was found in the descending                            colon. The polyp was sessile. The polyp was removed                            with a cold snare. Resection and retrieval were  complete. Verification of patient identification                            for the specimen was done. Estimated blood loss was                            minimal.                           Multiple diverticula were found in the sigmoid                            colon, descending colon and transverse colon.                           The exam was otherwise without abnormality on                            direct and retroflexion views. Complications:            No immediate complications. Estimated Blood Loss:     Estimated blood loss was minimal. Impression:               - One diminutive polyp in the descending colon,                            removed with a cold snare. Resected and retrieved.                           - Diverticulosis in the sigmoid colon, in the                            descending colon and in the transverse colon.                            - The examination was otherwise normal on direct                            and retroflexion views.                           - Personal history of colonic polyp - diminutive                            adenoma 2016 + FHx CRCA (father and sister) Recommendation:           - Patient has a contact number available for                            emergencies. The signs and symptoms of potential                            delayed complications were discussed with the                            patient.  Return to normal activities tomorrow.                            Written discharge instructions were provided to the                            patient.                           - Resume previous diet.                           - Continue present medications.                           - Repeat colonoscopy in 5 years. Kenney Peacemaker, MD 02/18/2024 12:40:18 PM This report has been signed electronically.

## 2024-02-18 NOTE — Progress Notes (Signed)
 Sedate, gd SR, tolerated procedure well, VSS, report to RN

## 2024-02-18 NOTE — Patient Instructions (Addendum)
 One tiny polyp removed. You have diverticulosis - thickened muscle rings and pouches in the colon wall. Please read the handout about this condition.  I appreciate the opportunity to care for you. Kenney Peacemaker, MD, FACG  YOU HAD AN ENDOSCOPIC PROCEDURE TODAY AT THE [facility]:   Refer to the procedure report that was given to you for any specific questions about what was found during the examination.  If the procedure report does not answer your questions, please call your gastroenterologist to clarify.  If you requested that your care partner not be given the details of your procedure findings, then the procedure report has been included in a sealed envelope for you to review at your convenience later.  YOU SHOULD EXPECT: Some feelings of bloating in the abdomen. Passage of more gas than usual.  Walking can help get rid of the air that was put into your GI tract during the procedure and reduce the bloating. If you had a lower endoscopy (such as a colonoscopy or flexible sigmoidoscopy) you may notice spotting of blood in your stool or on the toilet paper. If you underwent a bowel prep for your procedure, you may not have a normal bowel movement for a few days.  Please Note:  You might notice some irritation and congestion in your nose or some drainage.  This is from the oxygen used during your procedure.  There is no need for concern and it should clear up in a day or so.  SYMPTOMS TO REPORT IMMEDIATELY:  Following lower endoscopy (colonoscopy or flexible sigmoidoscopy):  Excessive amounts of blood in the stool  Significant tenderness or worsening of abdominal pains  Swelling of the abdomen that is new, acute  Fever of 100F or higher  For urgent or emergent issues, a gastroenterologist can be reached at any hour by calling [phone]. Do not use MyChart messaging for urgent concerns.    DIET:  We do recommend a small meal at first, but then you may proceed to your  regular diet.  Drink plenty of fluids but you should avoid alcoholic beverages for 24 hours.  ACTIVITY:  You should plan to take it easy for the rest of today and you should NOT DRIVE or use heavy machinery until tomorrow (because of the sedation medicines used during the test).    FOLLOW UP: Our staff will call the number listed on your records the next business day following your procedure.  We will call around 7:15- 8:00 am to check on you and address any questions or concerns that you may have regarding the information given to you following your procedure. If we do not reach you, we will leave a message.     If any biopsies were taken you will be contacted by phone or by letter within the next 1-3 weeks.  Please call us  at [phone] if you have not heard about the biopsies in 3 weeks.    SIGNATURES/CONFIDENTIALITY: You and/or your care partner have signed paperwork which will be entered into your electronic medical record.  These signatures attest to the fact that that the information above on your After Visit Summary has been reviewed and is understood.  Full responsibility of the confidentiality of this discharge information lies with you and/or your care-partner.

## 2024-02-18 NOTE — Telephone Encounter (Signed)
 Replacement form completed and faxed back to [facility]

## 2024-02-18 NOTE — Progress Notes (Signed)
 Called to room to assist during endoscopic procedure.  Patient ID and intended procedure confirmed with present staff. Received instructions for my participation in the procedure from the performing physician.

## 2024-02-19 ENCOUNTER — Telehealth: Payer: Self-pay

## 2024-02-19 NOTE — Telephone Encounter (Signed)
 Left message on follow up call.

## 2024-02-20 LAB — SURGICAL PATHOLOGY

## 2024-02-24 ENCOUNTER — Encounter: Payer: Self-pay | Admitting: Internal Medicine

## 2024-02-26 DIAGNOSIS — I25119 Atherosclerotic heart disease of native coronary artery with unspecified angina pectoris: Secondary | ICD-10-CM | POA: Diagnosis not present

## 2024-02-26 DIAGNOSIS — T466X5A Adverse effect of antihyperlipidemic and antiarteriosclerotic drugs, initial encounter: Secondary | ICD-10-CM | POA: Diagnosis not present

## 2024-02-26 DIAGNOSIS — E890 Postprocedural hypothyroidism: Secondary | ICD-10-CM | POA: Diagnosis not present

## 2024-02-26 DIAGNOSIS — R7303 Prediabetes: Secondary | ICD-10-CM | POA: Diagnosis not present

## 2024-02-26 DIAGNOSIS — I10 Essential (primary) hypertension: Secondary | ICD-10-CM | POA: Diagnosis not present

## 2024-02-26 DIAGNOSIS — M791 Myalgia, unspecified site: Secondary | ICD-10-CM | POA: Diagnosis not present

## 2024-02-26 DIAGNOSIS — M810 Age-related osteoporosis without current pathological fracture: Secondary | ICD-10-CM | POA: Diagnosis not present

## 2024-02-26 DIAGNOSIS — N183 Chronic kidney disease, stage 3 unspecified: Secondary | ICD-10-CM | POA: Diagnosis not present

## 2024-02-26 DIAGNOSIS — E782 Mixed hyperlipidemia: Secondary | ICD-10-CM | POA: Diagnosis not present

## 2024-02-27 DIAGNOSIS — M1812 Unilateral primary osteoarthritis of first carpometacarpal joint, left hand: Secondary | ICD-10-CM | POA: Diagnosis not present

## 2024-02-28 ENCOUNTER — Encounter: Payer: Self-pay | Admitting: Internal Medicine

## 2024-04-18 ENCOUNTER — Other Ambulatory Visit: Payer: Self-pay | Admitting: Cardiology

## 2024-04-29 DIAGNOSIS — H10413 Chronic giant papillary conjunctivitis, bilateral: Secondary | ICD-10-CM | POA: Diagnosis not present

## 2024-04-29 DIAGNOSIS — H43813 Vitreous degeneration, bilateral: Secondary | ICD-10-CM | POA: Diagnosis not present

## 2024-04-29 DIAGNOSIS — D492 Neoplasm of unspecified behavior of bone, soft tissue, and skin: Secondary | ICD-10-CM | POA: Diagnosis not present

## 2024-04-29 DIAGNOSIS — Z961 Presence of intraocular lens: Secondary | ICD-10-CM | POA: Diagnosis not present

## 2024-04-29 DIAGNOSIS — H02051 Trichiasis without entropian right upper eyelid: Secondary | ICD-10-CM | POA: Diagnosis not present

## 2024-05-11 DIAGNOSIS — H8110 Benign paroxysmal vertigo, unspecified ear: Secondary | ICD-10-CM | POA: Diagnosis not present

## 2024-05-11 DIAGNOSIS — N183 Chronic kidney disease, stage 3 unspecified: Secondary | ICD-10-CM | POA: Diagnosis not present

## 2024-05-20 DIAGNOSIS — J310 Chronic rhinitis: Secondary | ICD-10-CM | POA: Diagnosis not present

## 2024-05-20 DIAGNOSIS — R059 Cough, unspecified: Secondary | ICD-10-CM | POA: Diagnosis not present

## 2024-05-28 DIAGNOSIS — I10 Essential (primary) hypertension: Secondary | ICD-10-CM | POA: Diagnosis not present

## 2024-05-28 DIAGNOSIS — I25119 Atherosclerotic heart disease of native coronary artery with unspecified angina pectoris: Secondary | ICD-10-CM | POA: Diagnosis not present

## 2024-05-28 DIAGNOSIS — E890 Postprocedural hypothyroidism: Secondary | ICD-10-CM | POA: Diagnosis not present

## 2024-05-28 DIAGNOSIS — R7303 Prediabetes: Secondary | ICD-10-CM | POA: Diagnosis not present

## 2024-05-28 DIAGNOSIS — N183 Chronic kidney disease, stage 3 unspecified: Secondary | ICD-10-CM | POA: Diagnosis not present

## 2024-05-28 DIAGNOSIS — E782 Mixed hyperlipidemia: Secondary | ICD-10-CM | POA: Diagnosis not present

## 2024-07-14 ENCOUNTER — Other Ambulatory Visit: Payer: Self-pay | Admitting: Cardiology

## 2024-08-12 ENCOUNTER — Ambulatory Visit: Attending: Internal Medicine | Admitting: Internal Medicine

## 2024-08-12 ENCOUNTER — Encounter: Payer: Self-pay | Admitting: Internal Medicine

## 2024-08-12 VITALS — BP 148/50 | HR 53 | Ht 64.0 in | Wt 127.8 lb

## 2024-08-12 DIAGNOSIS — I25118 Atherosclerotic heart disease of native coronary artery with other forms of angina pectoris: Secondary | ICD-10-CM

## 2024-08-12 NOTE — Patient Instructions (Signed)
 Medication Instructions:  Your physician recommends that you continue on your current medications as directed. Please refer to the Current Medication list given to you today.    *If you need a refill on your cardiac medications before your next appointment, please call your pharmacy*  Lab Work: No labs ordered today    Testing/Procedures: No test ordered today   Follow-Up: At [facility], you and your health needs are our priority.  As part of our continuing mission to provide you with exceptional heart care, our providers are all part of one team.  This team includes your primary Cardiologist (physician) and Advanced Practice Providers or APPs (Physician Assistants and Nurse Practitioners) who all work together to provide you with the care you need, when you need it.  Your next appointment:   6 month(s)  Provider:   Lonni Hanson, MD

## 2024-08-12 NOTE — Progress Notes (Unsigned)
 Cardiology Office Note:  .   Date:  08/13/2024  ID:  Debra Barry, DOB [birth year], MRN [id number] PCP: Debra Charlene CROME, MD  [facility] Providers Cardiologist:  Debra Hanson, MD     History of Present Illness: .   Debra Barry is a 73 y.o. female with history of coronary artery disease (medically managed), aortic and mitral regurgitation, paroxysmal atrial tachycardia, hypertension, hyperlipidemia, impaired glucose tolerance, and hypothyroidism, who presents for follow-up of CAD, valvular heart disease, and hypertension.  She was last seen in our office in April by Tylene Lunch, NP, at which time she was feeling fairly well.  She continued to note some shortness of breath when lying flat on her back, though she was asymptomatic when sleeping on her side.  She denies chest pain.  No medication changes or additional testing were pursued.  Today, [name] reports that she has been feeling fairly well, though she has continued to be under quite a bit of stress.  She and her daughter are being forced to move out of their home and will likely need to reside in a motel until they are able to find a new permanent residence.  Despite this, she notes that her home blood pressures have been fairly well-controlled, 130/75 this morning.  She does not feel well if her blood pressure drops below this.  She has not had any chest pain, palpitations, lightheadedness, or edema.  She still gets short of breath when she lies flat on her back but has no difficulty with dyspnea when lying on her side or when being active.  She is tolerating her medications well, though she notes quite a bit of bruising from her Repatha  injections.  ROS: See HPI  Studies Reviewed: SABRA   EKG Interpretation Date/Time:  Wednesday August 12 2024 10:31:17 EDT Ventricular Rate:  53 PR Interval:  152 QRS Duration:  80 QT Interval:  454 QTC Calculation: 426 R Axis:   -55  Text Interpretation: Sinus bradycardia with  Premature atrial complexes Left anterior fascicular block Abnormal ECG When compared with ECG of 04-Feb-2024 10:00, Premature atrial complexes are now Present Confirmed by Laisha Rau, Debra [phone]) on 08/13/2024 7:51:18 AM    TTE (08/13/2023): Normal LV size and wall thickness.  LVEF 60-65% with normal wall motion and grade 1 diastolic dysfunction.  GLS -21%.  Normal RV size and function.  Normal PA pressure.  Normal biatrial size.  No pericardial effusion.  Moderate mitral regurgitation.  Mild tricuspid regurgitation.  Aortic sclerosis with mild-moderate regurgitation.  Mildly dilated ascending aorta (4.0 cm).  Risk Assessment/Calculations:     HYPERTENSION CONTROL Vitals:   08/12/24 1024 08/12/24 1109  BP: (!) 140/50 (!) 148/50    The patient's blood pressure is elevated above target today.  In order to address the patient's elevated BP: Blood pressure will be monitored at home to determine if medication changes need to be made.          Physical Exam:   VS:  BP (!) 148/50   Pulse (!) 53 Comment: 50 oximeter  Ht 5' 4 (1.626 m)   Wt 127 lb 12.8 oz (58 kg)   LMP  (LMP Unknown)   SpO2 99%   BMI 21.94 kg/m    Wt Readings from Last 3 Encounters:  08/12/24 127 lb 12.8 oz (58 kg)  02/18/24 128 lb 12.8 oz (58.4 kg)  02/04/24 132 lb (59.9 kg)    General:  NAD. Neck: No JVD or HJR. Lungs: Clear  to auscultation bilaterally without wheezes or crackles. Heart: Regular rate and rhythm with 2/6 systolic and 1/6 early diastolic murmurs. Abdomen: Soft, nontender, nondistended. Extremities: No lower extremity edema.  ASSESSMENT AND PLAN: .    Coronary artery disease with stable angina and hyperlipidemia: No chest pain or exertional dyspnea reported.  Continue current antianginal regimen of amlodipine  and isosorbide  mononitrate.  Defer rechallenging with a beta-blocker given baseline bradycardia.  Continue aspirin  and evolocumab  for secondary prevention.  Ongoing lipid monitoring per PCP;  most recent lipid panel in 05/2024 showed borderline LDL and triglycerides of 100 and 188, respectively.  If LDL were to increase further, we may need to add back ezetimibe to target an LDL closer to 70.  Lifestyle modifications were encouraged.  Valvular heart disease: [name] has a history of moderate mitral and aortic regurgitation.  She appears euvolemic with NYHA class I-II heart failure symptoms.  Plan to repeat an echocardiogram in about a year unless symptoms progress in the meantime.  Continue blood pressure control as below.  Hypertension: Blood pressure mildly elevated in the office today but typically better at home.  Continue current regimen of amlodipine , isosorbide  mononitrate, and telmisartan.  Paroxysmal atrial tachycardia: No significant palpitations reported.  EKG shows isolated PAC on a background of sinus bradycardia.  Defer rechallenging with beta-blocker or nondihydropyridine calcium channel blocker in the setting of baseline bradycardia.    Dispo: Return to clinic in 6 months  Signed, Debra Hanson, MD

## 2024-08-13 ENCOUNTER — Encounter: Payer: Self-pay | Admitting: Internal Medicine

## 2024-08-20 DIAGNOSIS — S8992XA Unspecified injury of left lower leg, initial encounter: Secondary | ICD-10-CM | POA: Diagnosis not present

## 2024-09-01 DIAGNOSIS — N1832 Chronic kidney disease, stage 3b: Secondary | ICD-10-CM | POA: Diagnosis not present

## 2024-09-01 DIAGNOSIS — E890 Postprocedural hypothyroidism: Secondary | ICD-10-CM | POA: Diagnosis not present

## 2024-09-01 DIAGNOSIS — R7303 Prediabetes: Secondary | ICD-10-CM | POA: Diagnosis not present

## 2024-09-01 DIAGNOSIS — T466X5A Adverse effect of antihyperlipidemic and antiarteriosclerotic drugs, initial encounter: Secondary | ICD-10-CM | POA: Diagnosis not present

## 2024-09-01 DIAGNOSIS — M791 Myalgia, unspecified site: Secondary | ICD-10-CM | POA: Diagnosis not present

## 2024-09-01 DIAGNOSIS — E782 Mixed hyperlipidemia: Secondary | ICD-10-CM | POA: Diagnosis not present

## 2024-09-01 DIAGNOSIS — I25119 Atherosclerotic heart disease of native coronary artery with unspecified angina pectoris: Secondary | ICD-10-CM | POA: Diagnosis not present

## 2024-09-01 DIAGNOSIS — M810 Age-related osteoporosis without current pathological fracture: Secondary | ICD-10-CM | POA: Diagnosis not present

## 2024-09-01 DIAGNOSIS — I10 Essential (primary) hypertension: Secondary | ICD-10-CM | POA: Diagnosis not present

## 2024-09-10 DIAGNOSIS — Z9181 History of falling: Secondary | ICD-10-CM | POA: Diagnosis not present

## 2024-09-10 DIAGNOSIS — Z1331 Encounter for screening for depression: Secondary | ICD-10-CM | POA: Diagnosis not present

## 2024-09-10 DIAGNOSIS — Z0000 Encounter for general adult medical examination without abnormal findings: Secondary | ICD-10-CM | POA: Diagnosis not present

## 2024-09-15 DIAGNOSIS — M79605 Pain in left leg: Secondary | ICD-10-CM | POA: Diagnosis not present

## 2024-09-15 DIAGNOSIS — M5432 Sciatica, left side: Secondary | ICD-10-CM | POA: Diagnosis not present

## 2024-10-10 ENCOUNTER — Other Ambulatory Visit: Payer: Self-pay | Admitting: Internal Medicine

## 2024-10-16 ENCOUNTER — Other Ambulatory Visit: Payer: Self-pay | Admitting: Physician Assistant

## 2024-10-16 DIAGNOSIS — M5416 Radiculopathy, lumbar region: Secondary | ICD-10-CM

## 2024-10-30 ENCOUNTER — Ambulatory Visit
Admission: RE | Admit: 2024-10-30 | Discharge: 2024-10-30 | Disposition: A | Source: Ambulatory Visit | Attending: Physician Assistant | Admitting: Physician Assistant

## 2024-10-30 DIAGNOSIS — M5416 Radiculopathy, lumbar region: Secondary | ICD-10-CM

## 2024-11-02 ENCOUNTER — Other Ambulatory Visit: Payer: Self-pay | Admitting: Internal Medicine

## 2025-01-06 ENCOUNTER — Ambulatory Visit: Admitting: Internal Medicine
